# Patient Record
Sex: Female | Born: 1950
Health system: Southern US, Community
[De-identification: ages and names within clinical notes are randomized; demographics above are authoritative.]

## PROBLEM LIST (undated history)

## (undated) DIAGNOSIS — K219 Gastro-esophageal reflux disease without esophagitis: Secondary | ICD-10-CM

## (undated) DIAGNOSIS — Z9889 Other specified postprocedural states: Secondary | ICD-10-CM

## (undated) DIAGNOSIS — R351 Nocturia: Secondary | ICD-10-CM

## (undated) DIAGNOSIS — M1990 Unspecified osteoarthritis, unspecified site: Secondary | ICD-10-CM

## (undated) DIAGNOSIS — Z8719 Personal history of other diseases of the digestive system: Secondary | ICD-10-CM

## (undated) DIAGNOSIS — R3915 Urgency of urination: Secondary | ICD-10-CM

## (undated) DIAGNOSIS — R519 Headache, unspecified: Secondary | ICD-10-CM

## (undated) DIAGNOSIS — G8929 Other chronic pain: Secondary | ICD-10-CM

## (undated) DIAGNOSIS — Z87442 Personal history of urinary calculi: Secondary | ICD-10-CM

## (undated) DIAGNOSIS — R51 Headache: Secondary | ICD-10-CM

## (undated) DIAGNOSIS — F419 Anxiety disorder, unspecified: Secondary | ICD-10-CM

## (undated) DIAGNOSIS — G510 Bell's palsy: Secondary | ICD-10-CM

## (undated) DIAGNOSIS — R350 Frequency of micturition: Secondary | ICD-10-CM

## (undated) DIAGNOSIS — R112 Nausea with vomiting, unspecified: Secondary | ICD-10-CM

## (undated) DIAGNOSIS — N3010 Interstitial cystitis (chronic) without hematuria: Secondary | ICD-10-CM

## (undated) HISTORY — DX: Unspecified osteoarthritis, unspecified site: M19.90

## (undated) HISTORY — DX: Gastro-esophageal reflux disease without esophagitis: K21.9

---

## 1978-04-30 HISTORY — PX: TUBAL LIGATION: SHX77

## 1985-04-30 HISTORY — PX: ABDOMINAL HYSTERECTOMY: SHX81

## 2000-04-30 HISTORY — PX: EXTRACORPOREAL SHOCK WAVE LITHOTRIPSY: SHX1557

## 2001-04-30 HISTORY — PX: OTHER SURGICAL HISTORY: SHX169

## 2001-10-13 ENCOUNTER — Other Ambulatory Visit: Admission: RE | Admit: 2001-10-13 | Discharge: 2001-10-13 | Payer: Self-pay | Admitting: Unknown Physician Specialty

## 2003-05-01 HISTORY — PX: OTHER SURGICAL HISTORY: SHX169

## 2003-12-30 HISTORY — PX: BUNIONECTOMY: SHX129

## 2004-03-30 HISTORY — PX: OTHER SURGICAL HISTORY: SHX169

## 2005-01-24 ENCOUNTER — Ambulatory Visit: Payer: Self-pay | Admitting: Internal Medicine

## 2005-01-30 ENCOUNTER — Ambulatory Visit (HOSPITAL_COMMUNITY): Admission: RE | Admit: 2005-01-30 | Discharge: 2005-01-30 | Payer: Self-pay | Admitting: Internal Medicine

## 2005-01-30 ENCOUNTER — Ambulatory Visit: Payer: Self-pay | Admitting: Internal Medicine

## 2005-04-18 ENCOUNTER — Ambulatory Visit: Payer: Self-pay | Admitting: Internal Medicine

## 2006-04-03 ENCOUNTER — Ambulatory Visit: Payer: Self-pay | Admitting: Internal Medicine

## 2007-05-01 HISTORY — PX: BREAST REDUCTION SURGERY: SHX8

## 2008-03-30 HISTORY — PX: NASAL SINUS SURGERY: SHX719

## 2010-12-06 ENCOUNTER — Encounter: Payer: Self-pay | Admitting: Gastroenterology

## 2011-01-08 ENCOUNTER — Encounter: Payer: Self-pay | Admitting: Gastroenterology

## 2011-01-08 ENCOUNTER — Ambulatory Visit (INDEPENDENT_AMBULATORY_CARE_PROVIDER_SITE_OTHER): Payer: BC Managed Care – PPO | Admitting: Gastroenterology

## 2011-01-08 DIAGNOSIS — K5900 Constipation, unspecified: Secondary | ICD-10-CM | POA: Insufficient documentation

## 2011-01-08 DIAGNOSIS — K219 Gastro-esophageal reflux disease without esophagitis: Secondary | ICD-10-CM

## 2011-01-08 NOTE — Patient Instructions (Addendum)
You should change the way you are taking your antiacid medicine (nexium) so that you are taking it 20-30 minutes prior to a decent meal as that is the way the pill is designed to work most effectively. Add pepcid (OTC), take one pill at bedtime every night. Please start taking citrucel (orange flavored) powder fiber supplement.  This may cause some bloating at first but that usually goes away. Begin with a small spoonful and work your way up to a large, heaping spoonful daily over a week. Trial of gas-ex, 1 pill with every meal. A copy of this information will be made available to Dr. Sherril Croon. We will get your Dr. Karilyn Cota colonoscopy report from 2009. Return to see see Dr. Christella Hartigan in 5-6 week.

## 2011-01-08 NOTE — Progress Notes (Signed)
HPI: This is a   very pleasant 60 year old woman who is here with her daughter today.   She has problems with her stomach.  Swells after eating.  Bloating.  Full of air, Belching always helps.  Sometimes the bloating affects her breathing.  Very bloated. This has been a problem for several.  She saw Dr. Karilyn Cota in 2009.  Underwent colonoscopy/EGD.  She was put on nexium twice a day. She was told she has hemorrhoids.  Small sliding hernia was noted, antral gastritis.  She has had trouble getting in to see Dr. Karilyn Cota.  Daughter felt she was being "blown off."    Takes nexium at 5:30 eats BF 2-3 hours later.  Second pill is usually about 1-2 hours prior to BF.  No caffiene, no etoh, non smoker.    She tends to be constipated.  She eats high fiber oatmeal every AM.  Usually has BM every 2-3 days, has to push and strain.  Can see rectal bleeding with BMs.  Over the past year, stable weight.     Review of systems: Pertinent positive and negative review of systems were noted in the above HPI section.  All other review of systems was otherwise negative.   Past Medical History  Diagnosis Date  . Arthritis   . Fibromyalgia   . GERD (gastroesophageal reflux disease)   . Kidney stones     Past Surgical History  Procedure Date  . Shoulder surgery   . Abdominal hysterectomy   . Bladder suspension   . Kidney stone surgery   . Nasal sinus surgery   . Bunionectomy      reports that she has quit smoking. She has never used smokeless tobacco. She reports that she does not drink alcohol or use illicit drugs.  family history includes Heart disease in her father and Lymphoma in her mother.    Current Medications, Allergies were all reviewed with the patient via [facility] electronic medical record system.    Physical Exam: BP 120/68  Pulse 60  Ht 5\' 2"  (1.575 m)  Wt 142 lb 3.2 oz (64.501 kg)  BMI 26.01 kg/m2 Constitutional: generally well-appearing Psychiatric: alert and oriented  x3 Eyes: extraocular movements intact Mouth: oral pharynx moist, no lesions Neck: supple no lymphadenopathy Cardiovascular: heart regular rate and rhythm Lungs: clear to auscultation bilaterally Abdomen: soft, nontender, nondistended, no obvious ascites, no peritoneal signs, normal bowel sounds Extremities: no lower extremity edema bilaterally Skin: no lesions on visible extremities Rectal examination with female assistant in room: Small internal and external hemorrhoids, nonthrombosed, stool was brown and Hemoccult negative. No masses in the distal rectum.   Assessment and plan: 60 y.o. female with minor hemorrhoidal bleeding, constipation, chronic GERD  I recommend she adjust her proton pump inhibitor twice daily. She will also add H2 blocker at night, bedtime. She will begin Citrucel and Gas-X to help with the bloating, constipation and hopefully can improve her hemorrhoidal bleeding as well. She will return to see me in 4-5 and sooner if needed.

## 2011-02-12 ENCOUNTER — Encounter: Payer: Self-pay | Admitting: Gastroenterology

## 2011-02-12 ENCOUNTER — Ambulatory Visit (INDEPENDENT_AMBULATORY_CARE_PROVIDER_SITE_OTHER): Payer: BC Managed Care – PPO | Admitting: Gastroenterology

## 2011-02-12 VITALS — BP 108/76 | HR 72 | Ht 62.0 in | Wt 142.6 lb

## 2011-02-12 DIAGNOSIS — R141 Gas pain: Secondary | ICD-10-CM

## 2011-02-12 DIAGNOSIS — R143 Flatulence: Secondary | ICD-10-CM

## 2011-02-12 DIAGNOSIS — R140 Abdominal distension (gaseous): Secondary | ICD-10-CM

## 2011-02-12 NOTE — Patient Instructions (Addendum)
Gastric emptying scan to check stomach function.  [name] on 03/06/11 1 pm arrive 1245 pm Nothing to eat or drink after 7 am Hold your Nexium and Pepcid 48 hours prior to the test. Need Dr. Patty Sermons EGD/Colonoscopy from 2009 Start keeping a food diary. Continue on nexium twice daily, pepcid a night. Citrucel once daily.

## 2011-02-12 NOTE — Progress Notes (Signed)
HPI: This is a  pleasant 60 year old woman who is here with her daughter today.  Has been on nexium bid, pepcid qhs, citrucel and gas ex since her last visit 5-6 weeks ago, for the most part she is taking all her meds. Her bowels are moving much easier.  She is not bothered by consipation.   Cannot tell if any particular foods are causing swelling, bloating.    Swelling, bloating are her most concerning symptoms.  Belching can help for a while.  Drinks one carbonated bev a day.  Does not eat much dairy.  Overall stable weight.  No overt blood in stool except minor red blood on TP.    Has complained about "bloating for several years"    Past Medical History:   Arthritis                                                    Fibromyalgia                                                 GERD (gastroesophageal reflux disease)                       Kidney stones                                               Past Surgical History:   SHOULDER SURGERY                                             ABDOMINAL HYSTERECTOMY                                       BLADDER SUSPENSION                                           KIDNEY STONE SURGERY                                         NASAL SINUS SURGERY                                          BUNIONECTOMY                                                 reports that she quit smoking about 35 years ago. She has never used smokeless tobacco. She reports that she does not  drink alcohol or use illicit drugs.  family history includes Heart disease in her father and Lymphoma in her mother.    Current medicines and allergies were reviewed in [facility]    Physical Exam: BP 108/76  Pulse 72  Ht 5\' 2"  (1.575 m)  Wt 142 lb 9.6 oz (64.683 kg)  BMI 26.08 kg/m2 Constitutional: generally well-appearing Psychiatric: alert and oriented x3 Abdomen: soft, nontender, nondistended, no obvious ascites, no peritoneal signs, normal bowel  sounds     Assessment and plan: 60 y.o. female with chronic bloating  This has been a problem of hers for many years. I suspect is functional. We have not yet gotten results from her previous gastroenterologist and we'll try again to track those down. She will begin keeping a food diary. She will also have a gastric emptying scan. In the meantime she'll stand proton pump inhibitor twice daily, H2 blocker once nightly, Citrucel daily.

## 2011-02-27 ENCOUNTER — Telehealth: Payer: Self-pay | Admitting: Gastroenterology

## 2011-02-27 NOTE — Telephone Encounter (Signed)
Colonsocopy 03/2008 Dr. Karilyn Cota (for lower abd pain, IDA): normal colonoscopy EGD 03/2008 Dr. Karilyn Cota (for abd pain, IDA): 2cm HH, otherwise normal; duodenum biopsies normal

## 2011-03-06 ENCOUNTER — Encounter (HOSPITAL_COMMUNITY)
Admission: RE | Admit: 2011-03-06 | Discharge: 2011-03-06 | Disposition: A | Discharge status: Home / Self Care | Payer: BC Managed Care – PPO | Source: Ambulatory Visit | Attending: Gastroenterology | Admitting: Gastroenterology

## 2011-03-06 DIAGNOSIS — R140 Abdominal distension (gaseous): Secondary | ICD-10-CM

## 2011-03-06 DIAGNOSIS — R6881 Early satiety: Secondary | ICD-10-CM | POA: Insufficient documentation

## 2011-03-06 DIAGNOSIS — K3189 Other diseases of stomach and duodenum: Secondary | ICD-10-CM | POA: Insufficient documentation

## 2011-03-06 DIAGNOSIS — R110 Nausea: Secondary | ICD-10-CM | POA: Insufficient documentation

## 2011-03-06 DIAGNOSIS — R141 Gas pain: Secondary | ICD-10-CM | POA: Insufficient documentation

## 2011-03-06 DIAGNOSIS — R142 Eructation: Secondary | ICD-10-CM | POA: Insufficient documentation

## 2011-03-06 DIAGNOSIS — K449 Diaphragmatic hernia without obstruction or gangrene: Secondary | ICD-10-CM | POA: Insufficient documentation

## 2011-03-06 MED ORDER — TECHNETIUM TC 99M SULFUR COLLOID
2.0000 | Freq: Once | INTRAVENOUS | Status: AC | PRN
  Administered 2011-03-06: 2 via INTRAVENOUS

## 2011-04-02 ENCOUNTER — Encounter (HOSPITAL_BASED_OUTPATIENT_CLINIC_OR_DEPARTMENT_OTHER): Payer: Self-pay | Admitting: *Deleted

## 2011-04-02 ENCOUNTER — Telehealth: Payer: Self-pay | Admitting: Gastroenterology

## 2011-04-02 NOTE — Telephone Encounter (Signed)
Pt is having surgery on Thursday and wants to cx ROV.  She will call back to reschedule

## 2011-04-02 NOTE — Patient Instructions (Signed)
Pt instructed NPO p MN 12/5 except nexium and neurontin w sip of h20. To [facility] 12/6 at 0700.  Needs hgb, ekg on arrival. [facility] guidelines reviewed .  Pt verbalized her understanding.  

## 2011-04-02 NOTE — Telephone Encounter (Signed)
Left message on machine to call back  

## 2011-04-04 NOTE — Progress Notes (Signed)
Pt instructed NPO p MN 12/5 except nexium and neurontin w sip of h20. To [facility] 12/6 at 0700.  Needs hgb, ekg on arrival. [facility] guidelines reviewed .  Pt verbalized her understanding.

## 2011-04-05 ENCOUNTER — Encounter (HOSPITAL_BASED_OUTPATIENT_CLINIC_OR_DEPARTMENT_OTHER)
Admission: RE | Disposition: A | Discharge status: Home / Self Care | Payer: Self-pay | Source: Ambulatory Visit | Attending: Urology

## 2011-04-05 ENCOUNTER — Encounter (HOSPITAL_BASED_OUTPATIENT_CLINIC_OR_DEPARTMENT_OTHER): Payer: Self-pay | Admitting: *Deleted

## 2011-04-05 ENCOUNTER — Other Ambulatory Visit: Payer: Self-pay

## 2011-04-05 ENCOUNTER — Ambulatory Visit (HOSPITAL_BASED_OUTPATIENT_CLINIC_OR_DEPARTMENT_OTHER): Payer: BC Managed Care – PPO | Admitting: Anesthesiology

## 2011-04-05 ENCOUNTER — Ambulatory Visit (HOSPITAL_BASED_OUTPATIENT_CLINIC_OR_DEPARTMENT_OTHER)
Admission: RE | Admit: 2011-04-05 | Discharge: 2011-04-06 | Disposition: A | Discharge status: Home / Self Care | Payer: BC Managed Care – PPO | Source: Ambulatory Visit | Attending: Urology | Admitting: Urology

## 2011-04-05 ENCOUNTER — Encounter (HOSPITAL_BASED_OUTPATIENT_CLINIC_OR_DEPARTMENT_OTHER): Payer: Self-pay | Admitting: Anesthesiology

## 2011-04-05 DIAGNOSIS — N816 Rectocele: Secondary | ICD-10-CM | POA: Insufficient documentation

## 2011-04-05 DIAGNOSIS — K219 Gastro-esophageal reflux disease without esophagitis: Secondary | ICD-10-CM | POA: Insufficient documentation

## 2011-04-05 DIAGNOSIS — IMO0001 Reserved for inherently not codable concepts without codable children: Secondary | ICD-10-CM | POA: Insufficient documentation

## 2011-04-05 DIAGNOSIS — Z90710 Acquired absence of both cervix and uterus: Secondary | ICD-10-CM | POA: Insufficient documentation

## 2011-04-05 DIAGNOSIS — N3946 Mixed incontinence: Secondary | ICD-10-CM | POA: Insufficient documentation

## 2011-04-05 DIAGNOSIS — N8110 Cystocele, unspecified: Secondary | ICD-10-CM

## 2011-04-05 DIAGNOSIS — N8111 Cystocele, midline: Secondary | ICD-10-CM | POA: Insufficient documentation

## 2011-04-05 DIAGNOSIS — Z79899 Other long term (current) drug therapy: Secondary | ICD-10-CM | POA: Insufficient documentation

## 2011-04-05 HISTORY — PX: PUBOVAGINAL SLING: SHX1035

## 2011-04-05 HISTORY — DX: Anxiety disorder, unspecified: F41.9

## 2011-04-05 HISTORY — DX: Other specified postprocedural states: Z98.890

## 2011-04-05 HISTORY — DX: Other specified postprocedural states: R11.2

## 2011-04-05 LAB — POCT HEMOGLOBIN-HEMACUE: Hemoglobin: 13.5 g/dL (ref 12.0–15.0)

## 2011-04-05 SURGERY — CREATION, PUBOVAGINAL SLING
Anesthesia: General | Site: Vagina | Wound class: Clean Contaminated

## 2011-04-05 MED ORDER — CIPROFLOXACIN HCL 500 MG PO TABS
500.0000 mg | ORAL_TABLET | Freq: Two times a day (BID) | ORAL | Status: AC
  Administered 2011-04-05: 500 mg via ORAL

## 2011-04-05 MED ORDER — STERILE WATER FOR IRRIGATION IR SOLN
Status: DC | PRN
  Administered 2011-04-05: 1000 mL

## 2011-04-05 MED ORDER — POTASSIUM CHLORIDE IN NACL 20-0.45 MEQ/L-% IV SOLN: INTRAVENOUS | Status: DC

## 2011-04-05 MED ORDER — SCOPOLAMINE 1 MG/3DAYS TD PT72: 1.0000 | MEDICATED_PATCH | TRANSDERMAL | Status: DC

## 2011-04-05 MED ORDER — SODIUM CHLORIDE 0.9 % IV SOLN
INTRAVENOUS | Status: DC | PRN
  Administered 2011-04-05 (×2): via VAGINAL

## 2011-04-05 MED ORDER — ONDANSETRON HCL 4 MG/2ML IJ SOLN
INTRAMUSCULAR | Status: DC | PRN
  Administered 2011-04-05: 4 mg via INTRAVENOUS

## 2011-04-05 MED ORDER — KETOROLAC TROMETHAMINE 30 MG/ML IJ SOLN
INTRAMUSCULAR | Status: DC | PRN
  Administered 2011-04-05: 30 mg via INTRAVENOUS

## 2011-04-05 MED ORDER — ACETAMINOPHEN 10 MG/ML IV SOLN
1000.0000 mg | Freq: Once | INTRAVENOUS | Status: AC
  Administered 2011-04-05: 1000 mg via INTRAVENOUS

## 2011-04-05 MED ORDER — LACTATED RINGERS IV SOLN
INTRAVENOUS | Status: DC
  Administered 2011-04-05: 100 mL/h via INTRAVENOUS
  Administered 2011-04-05 (×2): via INTRAVENOUS
  Administered 2011-04-05: 100 mL/h via INTRAVENOUS

## 2011-04-05 MED ORDER — KETOROLAC TROMETHAMINE 30 MG/ML IJ SOLN: 15.0000 mg | Freq: Once | INTRAMUSCULAR | Status: AC | PRN

## 2011-04-05 MED ORDER — OXYBUTYNIN CHLORIDE 5 MG PO TABS: 5.0000 mg | ORAL_TABLET | Freq: Three times a day (TID) | ORAL | Status: DC | PRN

## 2011-04-05 MED ORDER — DIPHENHYDRAMINE HCL 12.5 MG/5ML PO ELIX: 12.5000 mg | ORAL_SOLUTION | Freq: Four times a day (QID) | ORAL | Status: DC | PRN

## 2011-04-05 MED ORDER — OXYCODONE-ACETAMINOPHEN 5-325 MG PO TABS: 1.0000 | ORAL_TABLET | ORAL | Status: DC | PRN

## 2011-04-05 MED ORDER — KETOROLAC TROMETHAMINE 30 MG/ML IJ SOLN
30.0000 mg | Freq: Four times a day (QID) | INTRAMUSCULAR | Status: DC
  Administered 2011-04-05 – 2011-04-06 (×3): 30 mg via INTRAVENOUS

## 2011-04-05 MED ORDER — HYDROMORPHONE HCL PF 1 MG/ML IJ SOLN: 0.5000 mg | INTRAMUSCULAR | Status: DC | PRN

## 2011-04-05 MED ORDER — SODIUM CHLORIDE 0.9 % IR SOLN
Status: DC | PRN
  Administered 2011-04-05: 08:00:00

## 2011-04-05 MED ORDER — STERILE WATER FOR IRRIGATION IR SOLN
Status: DC | PRN
  Administered 2011-04-05: 3000 mL

## 2011-04-05 MED ORDER — HYDROMORPHONE HCL PF 1 MG/ML IJ SOLN: 0.2500 mg | INTRAMUSCULAR | Status: DC | PRN

## 2011-04-05 MED ORDER — SCOPOLAMINE 1 MG/3DAYS TD PT72
1.0000 | MEDICATED_PATCH | TRANSDERMAL | Status: DC
  Administered 2011-04-05: 1.5 mg via TRANSDERMAL

## 2011-04-05 MED ORDER — ACETAMINOPHEN 10 MG/ML IV SOLN
1000.0000 mg | Freq: Four times a day (QID) | INTRAVENOUS | Status: DC
  Administered 2011-04-05 – 2011-04-06 (×3): 1000 mg via INTRAVENOUS

## 2011-04-05 MED ORDER — SENNOSIDES-DOCUSATE SODIUM 8.6-50 MG PO TABS: 2.0000 | ORAL_TABLET | Freq: Every day | ORAL | Status: DC

## 2011-04-05 MED ORDER — DROPERIDOL 2.5 MG/ML IJ SOLN
INTRAMUSCULAR | Status: DC | PRN
  Administered 2011-04-05: 0.625 mg via INTRAVENOUS

## 2011-04-05 MED ORDER — BELLADONNA ALKALOIDS-OPIUM 16.2-60 MG RE SUPP
RECTAL | Status: DC | PRN
  Administered 2011-04-05: 1 via RECTAL

## 2011-04-05 MED ORDER — CEFAZOLIN SODIUM 1-5 GM-% IV SOLN
1.0000 g | Freq: Once | INTRAVENOUS | Status: AC
  Administered 2011-04-05: 1 g via INTRAVENOUS

## 2011-04-05 MED ORDER — LIDOCAINE HCL (CARDIAC) 20 MG/ML IV SOLN
INTRAVENOUS | Status: DC | PRN
  Administered 2011-04-05: 60 mg via INTRAVENOUS

## 2011-04-05 MED ORDER — DEXAMETHASONE SODIUM PHOSPHATE 4 MG/ML IJ SOLN
INTRAMUSCULAR | Status: DC | PRN
  Administered 2011-04-05: 10 mg via INTRAVENOUS

## 2011-04-05 MED ORDER — DIPHENHYDRAMINE HCL 50 MG/ML IJ SOLN: 12.5000 mg | Freq: Four times a day (QID) | INTRAMUSCULAR | Status: DC | PRN

## 2011-04-05 MED ORDER — ESTRADIOL 0.1 MG/GM VA CREA
TOPICAL_CREAM | VAGINAL | Status: DC | PRN
  Administered 2011-04-05: 1 via VAGINAL

## 2011-04-05 MED ORDER — MIDAZOLAM HCL 5 MG/5ML IJ SOLN
INTRAMUSCULAR | Status: DC | PRN
  Administered 2011-04-05: 1 mg via INTRAVENOUS

## 2011-04-05 MED ORDER — FENTANYL CITRATE 0.05 MG/ML IJ SOLN
INTRAMUSCULAR | Status: DC | PRN
  Administered 2011-04-05: 25 ug via INTRAVENOUS
  Administered 2011-04-05: 50 ug via INTRAVENOUS
  Administered 2011-04-05: 25 ug via INTRAVENOUS
  Administered 2011-04-05: 50 ug via INTRAVENOUS
  Administered 2011-04-05: 25 ug via INTRAVENOUS

## 2011-04-05 MED ORDER — ONDANSETRON HCL 4 MG/2ML IJ SOLN: 4.0000 mg | INTRAMUSCULAR | Status: DC | PRN

## 2011-04-05 MED ORDER — PROMETHAZINE HCL 25 MG/ML IJ SOLN: 6.2500 mg | INTRAMUSCULAR | Status: DC | PRN

## 2011-04-05 MED ORDER — ZOLPIDEM TARTRATE 5 MG PO TABS: 5.0000 mg | ORAL_TABLET | Freq: Every evening | ORAL | Status: DC | PRN

## 2011-04-05 MED ORDER — INDIGOTINDISULFONATE SODIUM 8 MG/ML IJ SOLN
INTRAMUSCULAR | Status: DC | PRN
  Administered 2011-04-05: 5 mL via INTRAVENOUS

## 2011-04-05 SURGICAL SUPPLY — 56 items
BAG DRAIN URO-CYSTO SKYTR STRL (DRAIN) IMPLANT
BAG URINE DRAINAGE (UROLOGICAL SUPPLIES) ×3 IMPLANT
BLADE SURG 10 STRL SS (BLADE) ×3 IMPLANT
BLADE SURG 15 STRL LF DISP TIS (BLADE) ×2 IMPLANT
BLADE SURG 15 STRL SS (BLADE) ×1
BLADE SURG ROTATE 9660 (MISCELLANEOUS) ×3 IMPLANT
BOOTIES KNEE HIGH SLOAN (MISCELLANEOUS) ×3 IMPLANT
CANISTER SUCTION 1200CC (MISCELLANEOUS) IMPLANT
CANISTER SUCTION 2500CC (MISCELLANEOUS) ×6 IMPLANT
CATH FOLEY 2WAY SLVR  5CC 16FR (CATHETERS) ×1
CATH FOLEY 2WAY SLVR 5CC 16FR (CATHETERS) ×2 IMPLANT
CLOTH BEACON ORANGE TIMEOUT ST (SAFETY) ×3 IMPLANT
COVER MAYO STAND STRL (DRAPES) IMPLANT
DERMABOND ADVANCED (GAUZE/BANDAGES/DRESSINGS)
DERMABOND ADVANCED .7 DNX12 (GAUZE/BANDAGES/DRESSINGS) IMPLANT
DISSECTOR BLUNT TIP ENDO 5MM (MISCELLANEOUS) IMPLANT
DISSECTOR ROUND CHERRY 3/8 STR (MISCELLANEOUS) IMPLANT
DRAIN PENROSE 18X1/2 LTX STRL (DRAIN) IMPLANT
DRAPE CAMERA CLOSED 9X96 (DRAPES) ×3 IMPLANT
DRAPE LG THREE QUARTER DISP (DRAPES) IMPLANT
DRAPE UNDERBUTTOCKS STRL (DRAPE) ×3 IMPLANT
FLOSEAL 10ML (HEMOSTASIS) IMPLANT
GAUZE SPONGE 4X4 16PLY XRAY LF (GAUZE/BANDAGES/DRESSINGS) ×3 IMPLANT
GLOVE BIO SURGEON STRL SZ7.5 (GLOVE) ×3 IMPLANT
GLOVE INDICATOR 6.5 STRL GRN (GLOVE) ×9 IMPLANT
GOWN PREVENTION PLUS LG XLONG (DISPOSABLE) ×3 IMPLANT
GOWN STRL REIN XL XLG (GOWN DISPOSABLE) ×3 IMPLANT
NEEDLE HYPO 22GX1.5 SAFETY (NEEDLE) ×6 IMPLANT
PACK CYSTOSCOPY (CUSTOM PROCEDURE TRAY) ×3 IMPLANT
PACKING VAGINAL (PACKING) ×3 IMPLANT
PAD PREP 24X48 CUFFED NSTRL (MISCELLANEOUS) ×3 IMPLANT
PENCIL BUTTON HOLSTER BLD 10FT (ELECTRODE) ×3 IMPLANT
PLUG CATH AND CAP STER (CATHETERS) ×3 IMPLANT
RETRACTOR LONRSTAR 16.6X16.6CM (MISCELLANEOUS) ×2 IMPLANT
RETRACTOR STAY HOOK 5MM (MISCELLANEOUS) ×3 IMPLANT
RETRACTOR STER APS 16.6X16.6CM (MISCELLANEOUS) ×3
SHEET LAVH (DRAPES) ×3 IMPLANT
SLING SOLYX SYSTEM SIS BX (SLING) IMPLANT
SPONGE LAP 18X18 X RAY DECT (DISPOSABLE) IMPLANT
SPONGE LAP 4X18 X RAY DECT (DISPOSABLE) ×6 IMPLANT
SUCTION FRAZIER TIP 10 FR DISP (SUCTIONS) ×3 IMPLANT
SUT ETHILON 2 0 PS N (SUTURE) IMPLANT
SUT MON AB 2-0 SH 27 (SUTURE)
SUT MON AB 2-0 SH27 (SUTURE) IMPLANT
SUT SILK 3 0 PS 1 (SUTURE) ×3 IMPLANT
SUT VIC AB 2-0 UR6 27 (SUTURE) ×24 IMPLANT
SYR BULB IRRIGATION 50ML (SYRINGE) ×3 IMPLANT
SYR CONTROL 10ML LL (SYRINGE) ×6 IMPLANT
SYRINGE 10CC LL (SYRINGE) ×3 IMPLANT
SYS PRF KIT VAG SUP UPHOLD (Sling) ×3 IMPLANT
TOWEL OR 17X24 6PK STRL BLUE (TOWEL DISPOSABLE) ×6 IMPLANT
TOWEL OR 17X26 4PK STRL BLUE (TOWEL DISPOSABLE) ×3 IMPLANT
TUBE CONNECTING 12X1/4 (SUCTIONS) ×6 IMPLANT
WATER STERILE IRR 3000ML UROMA (IV SOLUTION) ×3 IMPLANT
WATER STERILE IRR 500ML POUR (IV SOLUTION) ×6 IMPLANT
YANKAUER SUCT BULB TIP NO VENT (SUCTIONS) IMPLANT

## 2011-04-05 NOTE — Anesthesia Preprocedure Evaluation (Addendum)
Anesthesia Evaluation  Patient identified by MRN, date of birth, ID band Patient awake    Reviewed: Allergy & Precautions, H&P , NPO status , Patient's Chart, lab work & pertinent test results  History of Anesthesia Complications (+) PONV  Airway Mallampati: II TM Distance: >3 FB Neck ROM: Full    Dental No notable dental hx.    Pulmonary neg pulmonary ROS,  clear to auscultation  Pulmonary exam normal       Cardiovascular neg cardio ROS Regular Normal    Neuro/Psych PSYCHIATRIC DISORDERS Anxiety  Neuromuscular disease    GI/Hepatic negative GI ROS, Neg liver ROS, GERD-  Medicated,  Endo/Other  Negative Endocrine ROS  Renal/GU negative Renal ROS  Genitourinary negative   Musculoskeletal negative musculoskeletal ROS (+)   Abdominal   Peds negative pediatric ROS (+)  Hematology negative hematology ROS (+)   Anesthesia Other Findings   Reproductive/Obstetrics negative OB ROS                          Anesthesia Physical Anesthesia Plan  ASA: II  Anesthesia Plan: General   Post-op Pain Management:    Induction: Intravenous  Airway Management Planned: LMA  Additional Equipment:   Intra-op Plan:   Post-operative Plan:   Informed Consent: I have reviewed the patients History and Physical, chart, labs and discussed the procedure including the risks, benefits and alternatives for the proposed anesthesia with the patient or authorized representative who has indicated his/her understanding and acceptance.   Dental advisory given  Plan Discussed with: CRNA  Anesthesia Plan Comments:         Anesthesia Quick Evaluation

## 2011-04-05 NOTE — H&P (Signed)
Urology Admission H&P  Chief Complaint: urge and stress incontinence  History of Present Illness: The patient is a 60 year old female, para 3-3-0, who was evaluated in October of 2012, for a six-month history of mixed stress and urge incontinence. She has frequency Q. one half hour. She complains of pubic pain, and post void spasm. She is status post TAH in 1987, and anterior vault repair, enterocele repair, and rectocele repair, with associated TVT sling in 2005. Currently, the patient is found to have a bladder capacity of 368 cc, with early sensation. On physical exam she has a grade 3 cystocele, and grade 3 rectocele, with no enterocele. The area of the TVT appears normal, with good mucosal coverage. Cystoscopy reveals no evidence of urethral erosion.  Urodynamics shows a first sensation at 167 cc. There is no leak for Valsalva leak point pressure determination. Her flow rate is 13 cc per second, with bladder pressure of 9 cementer water. PVR is 39 cc.  The patient was felt to have surgery urge incontinence, with pelvic floor prolapse. She is concerned with the vaginal vault size, and she continues to be 6 reactive. We have discussed mesh surgical repair, and the fact that mesh sacrospinous fixation will contract approximately 25%, but should not make her vagina 2 small 4 sexual activity. The patient did have vaginal foreshortening in her previous surgery.  Past Medical History  Diagnosis Date  . Arthritis   . GERD (gastroesophageal reflux disease)   . Kidney stones   . PONV (postoperative nausea and vomiting)   . Fibromyalgia     rt facial nerve pain  . Hiatal hernia   . Anxiety     hx panic attacks;  no meds   Past Surgical History  Procedure Date  . Shoulder surgery   . Abdominal hysterectomy   . Bladder suspension   . Kidney stone surgery   . Nasal sinus surgery   . Bunionectomy   . Abdominal hysterectomy   . Breast surgery '09    bil reduction  . Tubal ligation '80     Home Medications:  Prescriptions prior to admission  Medication Sig Dispense Refill  . calcitonin, salmon, (MIACALCIN/FORTICAL) 200 UNIT/ACT nasal spray Place 1 spray into the nose daily.        . cholecalciferol (VITAMIN D) 1000 UNITS tablet Take 3,000 Units by mouth daily.        Marland Kitchen conjugated estrogens (PREMARIN) vaginal cream Place vaginally daily.        Marland Kitchen esomeprazole (NEXIUM) 40 MG capsule Take 40 mg by mouth 2 (two) times daily.        . fish oil-omega-3 fatty acids 1000 MG capsule Take 3,600 mg by mouth daily.        . fluticasone (FLONASE) 50 MCG/ACT nasal spray Place 2 sprays into the nose daily.        Marland Kitchen gabapentin (NEURONTIN) 300 MG capsule Take 300 mg by mouth 3 (three) times daily.        . Multiple Vitamins-Minerals (MULTIVITAMIN WITH MINERALS) tablet Take 1 tablet by mouth daily.        . vitamin C (ASCORBIC ACID) 500 MG tablet Take 500 mg by mouth daily.         Allergies:  Allergies  Allergen Reactions  . Milk-Related Compounds Other (See Comments)    DAIRY PRODUCTS--- SCRATCHY THROAT/ HX SINUS INFECTION  . Bactrim Hives    Family History  Problem Relation Age of Onset  . Lymphoma Mother   .  Heart disease Father    Social History:  reports that she quit smoking about 35 years ago. She has never used smokeless tobacco. She reports that she does not drink alcohol or use illicit drugs.  ROS  Physical Exam:  Vital signs in last 24 hours: Temp:  [97.5 F (36.4 C)] 97.5 F (36.4 C) (12/06 0740) Pulse Rate:  [55] 55  (12/06 0740) Resp:  [16] 16  (12/06 0740) BP: (119)/(74) 119/74 mmHg (12/06 0740) SpO2:  [97 %] 97 % (12/06 0740) Physical ExamPelvec: Grade 3 cystocele, Grade 3 Rectocele. No urethral hypermobility. + vaginal atrophy.   Laboratory Data:  Results for orders placed during the hospital encounter of 04/05/11 (from the past 24 hour(s))  POCT HEMOGLOBIN-HEMACUE     Status: Normal   Collection Time   04/05/11  7:37 AM      Component Value Range    Hemoglobin 13.5  12.0 - 15.0 (g/dL)   No results found for this or any previous visit (from the past 240 hour(s)). Creatinine:   Impression/Assessment:  Recurrent pelvic floor prolapse. The patient has large cystocele and rectocele. She will have [employer] uphold light mesh sacrospinous repair, and posterior pedicle repair. Procedure and complications have previously been discussed with the patient. Expectation for the result is excellent.  Plan:  Surgical repair of anterior vault prolapse, recurrent, and posterior vault prolapse, recurrent.  ,  I 04/05/2011, 8:13 AM

## 2011-04-05 NOTE — Op Note (Signed)
Pre-operative diagnosis: Anterior vaginal wall prolapse, recurrent  Postoperative diagnosis: Same  Operation: Anterior vaginal vault Culposacro-pexy with [employer] Uphold-Light mesh repair  Surgeon   Anesthesia general LMA  Preparation: After appropriate preanesthesia, the patient was brought to the operating room, and placed on the operating table in the dorsal supine position, where general LMA anesthesia was introduced.  Gen. "timeout" was accomplished. Expectation of success is excellent.  Review history: This 60 year old female, para 3-3-0, he is 6 years status post anterior vault repair, posterior vault repair, enterocele repair, and TVT transvaginal tape for stress incontinence and pelvic floor prolapse. Over the last 6 months, the patient has developed loss of level I support, and currently has grade 3 cystocele and rectocele, but no stress incontinence. Urodynamics was accomplished, and showed no leakage with or without reduction of the patient's cystocele. After extensive counseling, the patient has selected transvaginal mesh sacral repair.  Procedure: The half portion to long 1 retractors placed, and Foley is placed in the bladder, and the bladder drained of fluid. Vaginal inspection reveals grade 3 cystocele, bordering on grade 4, at the vaginal introitus. There is a large amount of scar in the anterior vault, as well as at the apex. The rectocele felt to be a grade 2 on examination, and scars noted at the distal portion of the posterior vaginal mucosa.  Using 50 cc of Marcaine 0.25% with epinephrine 1-200,000, the area of incision is injected, after being marked with a blue marking pen. This was marked 1 cm beneath the urethrovaginal fold. Using a 15 blade, horizontal incision is made across the anterior vaginal wall, and subcutaneous tissue dissected. The cystocele is dissected, and a Kelly plication was accomplished using 2-0 Vicryl horizontal mattress sutures. The  telemetry floor is then dissected, with finger palpation and dissection of the ischial spines bilaterally. The sacrospinous ligament is identified bilaterally.  Using the U. device, the suture limbs of the uphold light mesh are placed bilaterally through the sacrospinous ligaments. They are examined, and found to be at least one finger breath medial to the ischial spines. The mesh is then secured with 3 2-0 Vicryl sutures in the distal and proximal portions of the mesh, to tack it to the bladder wall, and the limbs are secured bilaterally. With the mesh in excellent position, with no folding, and loose against the bladder, the limbs are cut, and removed. Irrigation was accomplished, and there is no significant bleeding. Because the patient continues to be 6 reactive, I elected to leave all of the vaginal mucosa available, and therefore, closed the wound with running 2-0 Vicryl suture, as well as interrupted sutures.  Vaginal inspection now reveals that the patient's rectocele is minimal, and there was no recurrence of her enterocele. She has excellent level I support, and I elected to forego posterior dissection at this time, understanding that the patient may need to come back in the future for posterior dissection.  Indigocarmine was given, and cystoscopy was accomplished, shows normal urethra, normal bladder base, with clear blue reflux from both cortices. There is no bladder stone, tumor, or bladder diverticular formation.  The patient tolerated procedure well. The bladder was drained the Foley catheter, and vaginal packing with Estrace was placed. She was awakened taken recovery room in good condition.

## 2011-04-05 NOTE — Anesthesia Postprocedure Evaluation (Signed)
  Anesthesia Post-op Note  Patient: [name]  Procedure(s) Performed:  PUBO-VAGINAL SLING - ANTERIOR UPHOLD LITE  ; CYSTOSCOPY  Patient Location: PACU  Anesthesia Type: General  Level of Consciousness: awake and alert   Airway and Oxygen Therapy: Patient Spontanous Breathing  Post-op Pain: mild  Post-op Assessment: Post-op Vital signs reviewed, Patient's Cardiovascular Status Stable, Respiratory Function Stable, Patent Airway and No signs of Nausea or vomiting  Post-op Vital Signs: stable  Complications: No apparent anesthesia complications

## 2011-04-05 NOTE — Transfer of Care (Signed)
Immediate Anesthesia Transfer of Care Note  Patient: [name]  Procedure(s) Performed:  Leonides Grills - ANTERIOR UPHOLD LITE  ; CYSTOSCOPY  Patient Location: Patient transported to PACU with oxygen via face mask at 6 Liters / Min  Anesthesia Type: General  Level of Consciousness: awake and alert   Airway & Oxygen Therapy: Patient Spontanous Breathing and Patient connected to face mask oxygen  Post-op Assessment: Report given to PACU RN and Post -op Vital signs reviewed and stable  Post vital signs: Reviewed and stable  Complications: No apparent anesthesia complications   Pt comfortable

## 2011-04-05 NOTE — Anesthesia Procedure Notes (Signed)
Procedure Name: LMA Insertion Date/Time: 04/05/2011 8:33 AM Performed by: Lorrin Jackson Pre-anesthesia Checklist: Patient identified, Emergency Drugs available, Suction available and Patient being monitored Patient Re-evaluated:Patient Re-evaluated prior to inductionOxygen Delivery Method: Circle System Utilized Preoxygenation: Pre-oxygenation with 100% oxygen Intubation Type: IV induction Ventilation: Mask ventilation without difficulty LMA: LMA with gastric port inserted LMA Size: 4.0 Number of attempts: 1 Placement Confirmation: positive ETCO2 and breath sounds checked- equal and bilateral Tube secured with: Tape Dental Injury: Teeth and Oropharynx as per pre-operative assessment  Comments: NG tube via gastric port, Sx small amount of yellow tinged gastric content.

## 2011-04-06 ENCOUNTER — Encounter (HOSPITAL_BASED_OUTPATIENT_CLINIC_OR_DEPARTMENT_OTHER): Payer: Self-pay | Admitting: Urology

## 2011-04-06 MED ORDER — HYDROCODONE-ACETAMINOPHEN 10-325 MG PO TABS: 1.0000 | ORAL_TABLET | Freq: Four times a day (QID) | ORAL | Status: AC | PRN

## 2011-04-06 MED ORDER — HYDROCODONE-ACETAMINOPHEN 10-325 MG PO TABS: 1.0000 | ORAL_TABLET | Freq: Four times a day (QID) | ORAL | Status: DC | PRN

## 2011-04-06 MED ORDER — CIPROFLOXACIN HCL 500 MG PO TABS: 500.0000 mg | ORAL_TABLET | Freq: Two times a day (BID) | ORAL | Status: AC

## 2011-04-06 NOTE — Discharge Summary (Signed)
Physician Discharge Summary  Patient ID: [name] MRN: [id number] DOB/AGE: [birth year] 60 y.o.  Admit date: 04/05/2011 Discharge date: 04/06/2011  Admission Diagnoses:  Recurrent cystocele  Discharge Diagnoses: Same Active Problems:  * No active hospital problems. *    Discharged Condition: [facility] Course: Surgery, 04/05/11  Consults:none  Significant Diagnostic Studies: labs:   Treatments:Surgery 04/05/11  Discharge Exam: Blood pressure 107/62, pulse 52, temperature 97.4 F (36.3 C), temperature source Oral, resp. rate 18, height 5\' 2"  (1.575 m), weight 64.411 kg (142 lb), SpO2 96.00%. General appearance: alert and cooperative Pelvic: not indicated; status post hysterectomy, negative ROS  Disposition: Home or Self Care  Discharge Orders    Future Orders Please Complete By Expires   Diet - low sodium heart healthy      Increase activity slowly      Discharge instructions      Comments:   Ok to drive in 1 week. OK to shower OK to go shopping for 2 hrs.   Call MD for:  temperature >100.4      Call MD for:      Scheduling Instructions:   Severe vaginal bleedingHome Care Instructions: call if severe vaginal bleeding.  Activity Rest for the remainder of the day. Do not drive or operate equipment today. You may resume normal activities in one to two days unless specified otherwise by Dr. Daune Perch  Special Instructions Call [phone] if you have any of the following symptoms: Persistent or heavy bleeding Bleeding which continues after the first few urinations Large blood clots that are difficult to pass Urine stream diminishes or stops completely Fever equal to or higher than 101 degrees F Cloudy urine with a strong, foul odor Severe pain  Females should always wipe from front to back. You may feel some burning pain when you urinate. This should disappear with time. Applying moist heat to the lower abdomen or a hot tub bath may help relieve the  pain.  Follow-up In one week. Appointment has been made.        Discharge Medication List as of 04/06/2011  8:20 AM    CONTINUE these medications which have NOT CHANGED   Details  calcitonin, salmon, (MIACALCIN/FORTICAL) 200 UNIT/ACT nasal spray Place 1 spray into the nose daily.  , Until Discontinued, Historical Med    cholecalciferol (VITAMIN D) 1000 UNITS tablet Take 3,000 Units by mouth daily.  , Until Discontinued, Historical Med    conjugated estrogens (PREMARIN) vaginal cream Place vaginally daily.  , Until Discontinued, Historical Med    esomeprazole (NEXIUM) 40 MG capsule Take 40 mg by mouth 2 (two) times daily.  , Until Discontinued, Historical Med    fish oil-omega-3 fatty acids 1000 MG capsule Take 3,600 mg by mouth daily.  , Until Discontinued, Historical Med    fluticasone (FLONASE) 50 MCG/ACT nasal spray Place 2 sprays into the nose daily.  , Until Discontinued, Historical Med    gabapentin (NEURONTIN) 300 MG capsule Take 300 mg by mouth 3 (three) times daily.  , Until Discontinued, Historical Med    Multiple Vitamins-Minerals (MULTIVITAMIN WITH MINERALS) tablet Take 1 tablet by mouth daily.  , Until Discontinued, Historical Med    vitamin C (ASCORBIC ACID) 500 MG tablet Take 500 mg by mouth daily.  , Until Discontinued, Historical Med       Follow-up Information    Follow up with Jethro Bolus I, MD.   Contact information:   [address] [place], [address] [facility]  [place] [address] [phone]          Signed: Jethro Bolus I 04/06/2011, 8:22 AM

## 2011-04-11 ENCOUNTER — Ambulatory Visit: Payer: BC Managed Care – PPO | Admitting: Gastroenterology

## 2011-12-17 ENCOUNTER — Other Ambulatory Visit: Payer: Self-pay | Admitting: Urology

## 2012-02-27 ENCOUNTER — Encounter (HOSPITAL_BASED_OUTPATIENT_CLINIC_OR_DEPARTMENT_OTHER): Payer: Self-pay | Admitting: *Deleted

## 2012-02-27 NOTE — Progress Notes (Addendum)
NPO AFTER MN. ARRIVES AT 0600. NEEDS HG. PRE-OP PENDING FOR DR LOWE. WILL TAKE NEXIUM AND GABAPENTIN AM OF SURG W/ SIP OF WATER. REVIEWED [facility] GUIDELINES, WILL BRING MEDS.  PT STATES IS ON A GLUTEN-FREE DIET.

## 2012-02-29 NOTE — H&P (Addendum)
   [name] is a 61 year old, G4P3, status post tubal ligation and hysterectomy who presents for preop evaluation for removal of both tubes and ovaries.  She has a history of right lower quadrant pain.  She has an ovarian cyst which on ultrasound in May showed a 2.1 cm cyst, clear fluid consistent with a simple cyst.  She does have a CAT scan showing this.  She is postmenopausal.  She had a normal CA 125.  She does have intermittent right lower quadrant pain.  She is also having rectocele repair by Dr. Patsi Sears, and she would like to have both ovaries removed at this time in a combined procedure.    Past medical history is significant for hiatal hernia and also allergies, osteoporosis, arthritis, history of urinary tract infections.  She has had multiple surgeries including multiple bladder surgeries, breast reduction, shoulder surgery and hysterectomy.  She has multiple medicines including Premarin vaginal cream, Nexium, Neurontin, Flonase, calcitonin that she takes for osteoporosis, multivitamins, calcium and fish oil.  She has no known drug allergies. O: Blood pressure 130/80.  Heart regular rate and rhythm.  Lungs are clear to auscultation bilaterally.  Abdomen is nondistended and nontender.  Pelvic exam was deferred.  Recent ultrasound shows a 2 cm right ovarian cyst.  Left ovary appears to be normal.  This is also consistent with a recent CAT scan. A/P: Right lower quadrant pain.  Stable right ovarian cyst.  Patient desires laparoscopic removal of both tubes and ovaries seeing that she is postmenopausal.  Discussed the risks and benefits of the procedure at length which include but not limited to risk of infection, bleeding, damage to bowel, bladder, ureters.  If there is something precancerous or cancerous, it may require further surgeries.  If we are unable to complete the surgery laparoscopically, she would like to discontinue and discuss laparotomy options.  I discussed the procedure, its recovery  at length.  She gives her informed consent. Dineen Kid Rana Snare, MD/tch   patiennt has been seen and examined.   All of her questions were answered.  Labs and vital signs reviewed.  Informed consent has been obtained.                03/03/12 0715 DL

## 2012-03-02 NOTE — Anesthesia Preprocedure Evaluation (Addendum)
Anesthesia Evaluation  Patient identified by MRN, date of birth, ID band Patient awake    Reviewed: Allergy & Precautions, H&P , NPO status , Patient's Chart, lab work & pertinent test results  History of Anesthesia Complications (+) PONV  Airway Mallampati: II TM Distance: >3 FB Neck ROM: Full    Dental No notable dental hx.    Pulmonary neg pulmonary ROS,  breath sounds clear to auscultation  Pulmonary exam normal       Cardiovascular Exercise Tolerance: Good negative cardio ROS  Rhythm:Regular Rate:Normal  ECG: SB 48   Neuro/Psych Anxiety Right trigeminal neuralgia, which often makes her neck spasm and hurt also.    GI/Hepatic Neg liver ROS, hiatal hernia, GERD-  Medicated,  Endo/Other  negative endocrine ROS  Renal/GU negative Renal ROS  negative genitourinary   Musculoskeletal negative musculoskeletal ROS (+)   Abdominal   Peds negative pediatric ROS (+)  Hematology negative hematology ROS (+)   Anesthesia Other Findings   Reproductive/Obstetrics negative OB ROS                         Anesthesia Physical Anesthesia Plan  ASA: II  Anesthesia Plan: General   Post-op Pain Management:    Induction: Intravenous  Airway Management Planned: Oral ETT  Additional Equipment:   Intra-op Plan:   Post-operative Plan: Extubation in OR  Informed Consent: I have reviewed the patients History and Physical, chart, labs and discussed the procedure including the risks, benefits and alternatives for the proposed anesthesia with the patient or authorized representative who has indicated his/her understanding and acceptance.   Dental advisory given  Plan Discussed with: CRNA  Anesthesia Plan Comments: (Scopolamine patch ordered. (Used last time with success).)       Anesthesia Quick Evaluation

## 2012-03-03 ENCOUNTER — Ambulatory Visit (HOSPITAL_BASED_OUTPATIENT_CLINIC_OR_DEPARTMENT_OTHER): Payer: Managed Care, Other (non HMO) | Admitting: Anesthesiology

## 2012-03-03 ENCOUNTER — Encounter (HOSPITAL_BASED_OUTPATIENT_CLINIC_OR_DEPARTMENT_OTHER): Payer: Self-pay | Admitting: Anesthesiology

## 2012-03-03 ENCOUNTER — Ambulatory Visit (HOSPITAL_BASED_OUTPATIENT_CLINIC_OR_DEPARTMENT_OTHER)
Admission: RE | Admit: 2012-03-03 | Discharge: 2012-03-04 | Disposition: A | Discharge status: Home / Self Care | Payer: Managed Care, Other (non HMO) | Source: Ambulatory Visit | Attending: Obstetrics and Gynecology | Admitting: Obstetrics and Gynecology

## 2012-03-03 ENCOUNTER — Encounter (HOSPITAL_BASED_OUTPATIENT_CLINIC_OR_DEPARTMENT_OTHER)
Admission: RE | Disposition: A | Discharge status: Home / Self Care | Payer: Self-pay | Source: Ambulatory Visit | Attending: Obstetrics and Gynecology

## 2012-03-03 ENCOUNTER — Encounter (HOSPITAL_BASED_OUTPATIENT_CLINIC_OR_DEPARTMENT_OTHER): Payer: Self-pay | Admitting: *Deleted

## 2012-03-03 DIAGNOSIS — N949 Unspecified condition associated with female genital organs and menstrual cycle: Secondary | ICD-10-CM | POA: Insufficient documentation

## 2012-03-03 DIAGNOSIS — K219 Gastro-esophageal reflux disease without esophagitis: Secondary | ICD-10-CM | POA: Insufficient documentation

## 2012-03-03 DIAGNOSIS — N816 Rectocele: Secondary | ICD-10-CM | POA: Insufficient documentation

## 2012-03-03 DIAGNOSIS — Z90710 Acquired absence of both cervix and uterus: Secondary | ICD-10-CM | POA: Insufficient documentation

## 2012-03-03 DIAGNOSIS — N736 Female pelvic peritoneal adhesions (postinfective): Secondary | ICD-10-CM | POA: Insufficient documentation

## 2012-03-03 DIAGNOSIS — Z79899 Other long term (current) drug therapy: Secondary | ICD-10-CM | POA: Insufficient documentation

## 2012-03-03 DIAGNOSIS — R109 Unspecified abdominal pain: Secondary | ICD-10-CM | POA: Insufficient documentation

## 2012-03-03 DIAGNOSIS — R350 Frequency of micturition: Secondary | ICD-10-CM | POA: Insufficient documentation

## 2012-03-03 DIAGNOSIS — N3946 Mixed incontinence: Secondary | ICD-10-CM | POA: Insufficient documentation

## 2012-03-03 HISTORY — DX: Personal history of other diseases of the digestive system: Z87.19

## 2012-03-03 HISTORY — DX: Frequency of micturition: R35.0

## 2012-03-03 HISTORY — DX: Nocturia: R35.1

## 2012-03-03 HISTORY — DX: Urgency of urination: R39.15

## 2012-03-03 HISTORY — PX: VAGINAL PROLAPSE REPAIR: SHX830

## 2012-03-03 HISTORY — DX: Headache, unspecified: R51.9

## 2012-03-03 HISTORY — DX: Headache: R51

## 2012-03-03 HISTORY — DX: Personal history of urinary calculi: Z87.442

## 2012-03-03 HISTORY — PX: LAPAROSCOPY: SHX197

## 2012-03-03 HISTORY — DX: Interstitial cystitis (chronic) without hematuria: N30.10

## 2012-03-03 HISTORY — PX: LAPAROSCOPIC LYSIS OF ADHESIONS: SHX5905

## 2012-03-03 HISTORY — DX: Bell's palsy: G51.0

## 2012-03-03 HISTORY — DX: Other chronic pain: G89.29

## 2012-03-03 SURGERY — LAPAROSCOPY OPERATIVE
Anesthesia: General | Site: Vagina | Wound class: Clean

## 2012-03-03 MED ORDER — LIDOCAINE HCL (CARDIAC) 20 MG/ML IV SOLN
INTRAVENOUS | Status: DC | PRN
  Administered 2012-03-03: 50 mg via INTRAVENOUS

## 2012-03-03 MED ORDER — ACETAMINOPHEN 10 MG/ML IV SOLN
1000.0000 mg | Freq: Four times a day (QID) | INTRAVENOUS | Status: AC
  Administered 2012-03-03: 1000 mg via INTRAVENOUS
  Filled 2012-03-03: qty 100

## 2012-03-03 MED ORDER — ONDANSETRON HCL 4 MG/2ML IJ SOLN
INTRAMUSCULAR | Status: DC | PRN
  Administered 2012-03-03: 4 mg via INTRAVENOUS

## 2012-03-03 MED ORDER — SODIUM CHLORIDE 0.9 % IR SOLN
Status: DC | PRN
  Administered 2012-03-03: 09:00:00

## 2012-03-03 MED ORDER — LACTATED RINGERS IV SOLN
INTRAVENOUS | Status: DC
  Administered 2012-03-03: 09:00:00 via INTRAVENOUS
  Administered 2012-03-03: 100 mL/h via INTRAVENOUS
  Filled 2012-03-03: qty 1000

## 2012-03-03 MED ORDER — DIPHENHYDRAMINE HCL 50 MG/ML IJ SOLN
12.5000 mg | Freq: Four times a day (QID) | INTRAMUSCULAR | Status: DC | PRN
  Filled 2012-03-03: qty 0.5

## 2012-03-03 MED ORDER — GLYCOPYRROLATE 0.2 MG/ML IJ SOLN
INTRAMUSCULAR | Status: DC | PRN
  Administered 2012-03-03: 0.2 mg via INTRAVENOUS

## 2012-03-03 MED ORDER — BELLADONNA ALKALOIDS-OPIUM 16.2-60 MG RE SUPP
RECTAL | Status: DC | PRN
  Administered 2012-03-03: 1 via RECTAL

## 2012-03-03 MED ORDER — MIDAZOLAM HCL 5 MG/5ML IJ SOLN
INTRAMUSCULAR | Status: DC | PRN
  Administered 2012-03-03: 2 mg via INTRAVENOUS

## 2012-03-03 MED ORDER — ROCURONIUM BROMIDE 100 MG/10ML IV SOLN
INTRAVENOUS | Status: DC | PRN
  Administered 2012-03-03: 50 mg via INTRAVENOUS

## 2012-03-03 MED ORDER — HYDROMORPHONE HCL PF 1 MG/ML IJ SOLN
0.5000 mg | INTRAMUSCULAR | Status: DC | PRN
  Filled 2012-03-03: qty 1

## 2012-03-03 MED ORDER — CIPROFLOXACIN HCL 500 MG PO TABS: 500.0000 mg | ORAL_TABLET | Freq: Two times a day (BID) | ORAL | Status: DC

## 2012-03-03 MED ORDER — BUPIVACAINE-EPINEPHRINE 0.5% -1:200000 IJ SOLN: INTRAMUSCULAR | Status: DC | PRN

## 2012-03-03 MED ORDER — URELLE 81 MG PO TABS
1.0000 | ORAL_TABLET | Freq: Three times a day (TID) | ORAL | Status: DC
Start: 2012-03-03 — End: 2012-06-10

## 2012-03-03 MED ORDER — PROMETHAZINE HCL 25 MG/ML IJ SOLN
6.2500 mg | INTRAMUSCULAR | Status: DC | PRN
  Filled 2012-03-03: qty 1

## 2012-03-03 MED ORDER — ESTRADIOL 0.1 MG/GM VA CREA
TOPICAL_CREAM | VAGINAL | Status: DC | PRN
  Administered 2012-03-03: 1 via VAGINAL

## 2012-03-03 MED ORDER — BISACODYL 5 MG PO TBEC
5.0000 mg | DELAYED_RELEASE_TABLET | Freq: Every day | ORAL | Status: DC | PRN
  Filled 2012-03-03: qty 1

## 2012-03-03 MED ORDER — STERILE WATER FOR IRRIGATION IR SOLN
Status: DC | PRN
  Administered 2012-03-03: 10 mL

## 2012-03-03 MED ORDER — HYDROCODONE-ACETAMINOPHEN 5-325 MG PO TABS
1.0000 | ORAL_TABLET | ORAL | Status: DC | PRN
  Administered 2012-03-03: 1 via ORAL
  Administered 2012-03-03: 2 via ORAL
  Administered 2012-03-03: 1 via ORAL
  Filled 2012-03-03: qty 2

## 2012-03-03 MED ORDER — INDIGOTINDISULFONATE SODIUM 8 MG/ML IJ SOLN
INTRAMUSCULAR | Status: DC | PRN
  Administered 2012-03-03: 5 mL via INTRAVENOUS

## 2012-03-03 MED ORDER — LACTATED RINGERS IR SOLN
Status: DC | PRN
  Administered 2012-03-03: 3000 mL

## 2012-03-03 MED ORDER — SODIUM CHLORIDE 0.9 % IV SOLN
INTRAVENOUS | Status: DC | PRN
  Administered 2012-03-03: 10:00:00 via VAGINAL

## 2012-03-03 MED ORDER — ZOLPIDEM TARTRATE 5 MG PO TABS
5.0000 mg | ORAL_TABLET | Freq: Every evening | ORAL | Status: DC | PRN
  Filled 2012-03-03: qty 1

## 2012-03-03 MED ORDER — CEFOTETAN DISODIUM 2 G IJ SOLR
2.0000 g | INTRAMUSCULAR | Status: AC
  Administered 2012-03-03: 2 g via INTRAVENOUS
  Filled 2012-03-03: qty 2

## 2012-03-03 MED ORDER — ONDANSETRON HCL 4 MG/2ML IJ SOLN
4.0000 mg | INTRAMUSCULAR | Status: DC | PRN
Start: 2012-03-03 — End: 2012-03-04
  Filled 2012-03-03: qty 2

## 2012-03-03 MED ORDER — SODIUM CHLORIDE 0.45 % IV SOLN
INTRAVENOUS | Status: DC
  Administered 2012-03-03 – 2012-03-04 (×2): via INTRAVENOUS
  Filled 2012-03-03: qty 1000

## 2012-03-03 MED ORDER — SCOPOLAMINE 1 MG/3DAYS TD PT72
1.0000 | MEDICATED_PATCH | TRANSDERMAL | Status: DC
  Administered 2012-03-03: 1.5 mg via TRANSDERMAL

## 2012-03-03 MED ORDER — CEFAZOLIN SODIUM 1-5 GM-% IV SOLN
1.0000 g | INTRAVENOUS | Status: DC
  Filled 2012-03-03: qty 50

## 2012-03-03 MED ORDER — CIPROFLOXACIN HCL 500 MG PO TABS
500.0000 mg | ORAL_TABLET | Freq: Two times a day (BID) | ORAL | Status: DC
  Administered 2012-03-03 (×2): 500 mg via ORAL
  Filled 2012-03-03: qty 1

## 2012-03-03 MED ORDER — SODIUM CHLORIDE 0.9 % IJ SOLN: INTRAMUSCULAR | Status: DC | PRN

## 2012-03-03 MED ORDER — PROPOFOL 10 MG/ML IV BOLUS
INTRAVENOUS | Status: DC | PRN
  Administered 2012-03-03: 160 mg via INTRAVENOUS

## 2012-03-03 MED ORDER — DEXAMETHASONE SODIUM PHOSPHATE 4 MG/ML IJ SOLN
INTRAMUSCULAR | Status: DC | PRN
  Administered 2012-03-03: 10 mg via INTRAVENOUS

## 2012-03-03 MED ORDER — HYDROMORPHONE HCL PF 1 MG/ML IJ SOLN
0.2500 mg | INTRAMUSCULAR | Status: DC | PRN
  Filled 2012-03-03: qty 1

## 2012-03-03 MED ORDER — OXYBUTYNIN CHLORIDE 5 MG PO TABS
5.0000 mg | ORAL_TABLET | Freq: Three times a day (TID) | ORAL | Status: DC | PRN
  Filled 2012-03-03: qty 1

## 2012-03-03 MED ORDER — HYDROCODONE-ACETAMINOPHEN 7.5-650 MG PO TABS: 1.0000 | ORAL_TABLET | Freq: Four times a day (QID) | ORAL | Status: DC | PRN

## 2012-03-03 MED ORDER — BUPIVACAINE HCL (PF) 0.25 % IJ SOLN
INTRAMUSCULAR | Status: DC | PRN
  Administered 2012-03-03: 10 mL

## 2012-03-03 MED ORDER — SCOPOLAMINE 1 MG/3DAYS TD PT72
1.0000 | MEDICATED_PATCH | TRANSDERMAL | Status: DC
  Filled 2012-03-03: qty 1

## 2012-03-03 MED ORDER — FENTANYL CITRATE 0.05 MG/ML IJ SOLN
INTRAMUSCULAR | Status: DC | PRN
  Administered 2012-03-03 (×3): 50 ug via INTRAVENOUS

## 2012-03-03 SURGICAL SUPPLY — 89 items
APPLICATOR COTTON TIP 6IN STRL (MISCELLANEOUS) ×5 IMPLANT
BAG URINE DRAINAGE (UROLOGICAL SUPPLIES) ×10 IMPLANT
BANDAGE ADHESIVE 1X3 (GAUZE/BANDAGES/DRESSINGS) IMPLANT
BENZOIN TINCTURE PRP APPL 2/3 (GAUZE/BANDAGES/DRESSINGS) IMPLANT
BLADE SURG 10 STRL SS (BLADE) ×5 IMPLANT
BLADE SURG 11 STRL SS (BLADE) ×5 IMPLANT
BLADE SURG 15 STRL LF DISP TIS (BLADE) ×4 IMPLANT
BLADE SURG 15 STRL SS (BLADE) ×1
BOOTIES KNEE HIGH SLOAN (MISCELLANEOUS) ×5 IMPLANT
CANISTER SUCTION 1200CC (MISCELLANEOUS) IMPLANT
CANISTER SUCTION 2500CC (MISCELLANEOUS) ×5 IMPLANT
CATH FOLEY 2WAY SLVR  5CC 16FR (CATHETERS) ×2
CATH FOLEY 2WAY SLVR 5CC 16FR (CATHETERS) ×8 IMPLANT
CLOTH BEACON ORANGE TIMEOUT ST (SAFETY) ×5 IMPLANT
COVER MAYO STAND STRL (DRAPES) ×5 IMPLANT
COVER TABLE BACK 60X90 (DRAPES) ×5 IMPLANT
DERMABOND ADVANCED (GAUZE/BANDAGES/DRESSINGS) ×1
DERMABOND ADVANCED .7 DNX12 (GAUZE/BANDAGES/DRESSINGS) ×4 IMPLANT
DEVICE CAPIO SLIM SINGLE (INSTRUMENTS) ×5 IMPLANT
DISSECTOR ROUND CHERRY 3/8 STR (MISCELLANEOUS) IMPLANT
DRAPE CAMERA CLOSED 9X96 (DRAPES) ×5 IMPLANT
DRAPE UNDERBUTTOCKS STRL (DRAPE) ×5 IMPLANT
ELECT REM PT RETURN 9FT ADLT (ELECTROSURGICAL) ×5
ELECTRODE REM PT RTRN 9FT ADLT (ELECTROSURGICAL) ×4 IMPLANT
FLOSEAL 10ML (HEMOSTASIS) IMPLANT
FORCEPS CUTTING 33CM 5MM (CUTTING FORCEPS) IMPLANT
FORCEPS CUTTING 45CM 5MM (CUTTING FORCEPS) IMPLANT
GAUZE SPONGE 4X4 16PLY XRAY LF (GAUZE/BANDAGES/DRESSINGS) IMPLANT
GLOVE BIO SURGEON STRL SZ 6.5 (GLOVE) ×10 IMPLANT
GLOVE BIO SURGEON STRL SZ7.5 (GLOVE) ×15 IMPLANT
GLOVE BIO SURGEON STRL SZ8 (GLOVE) ×5 IMPLANT
GLOVE ECLIPSE 6.0 STRL STRAW (GLOVE) ×5 IMPLANT
GLOVE SURG ORTHO 8.0 STRL STRW (GLOVE) ×5 IMPLANT
GOWN PREVENTION PLUS LG XLONG (DISPOSABLE) ×15 IMPLANT
GOWN STRL REIN XL XLG (GOWN DISPOSABLE) ×5 IMPLANT
HOLDER FOLEY CATH W/STRAP (MISCELLANEOUS) ×5 IMPLANT
HOOK RETRACTION 12 ELAST STAY (MISCELLANEOUS) IMPLANT
MARKER PEN SURG W/LABELS BLK (STERILIZATION PRODUCTS) ×5 IMPLANT
NEEDLE 1/2 CIR CATGUT .05X1.09 (NEEDLE) ×5 IMPLANT
NEEDLE HYPO 22GX1.5 SAFETY (NEEDLE) ×10 IMPLANT
NEEDLE INSUFFLATION 14GA 120MM (NEEDLE) ×5 IMPLANT
NS IRRIG 500ML POUR BTL (IV SOLUTION) ×5 IMPLANT
PACK BASIN DAY SURGERY FS (CUSTOM PROCEDURE TRAY) ×5 IMPLANT
PACK LAPAROSCOPY II (CUSTOM PROCEDURE TRAY) ×5 IMPLANT
PACKING VAGINAL (PACKING) ×5 IMPLANT
PAD OB MATERNITY 4.3X12.25 (PERSONAL CARE ITEMS) ×5 IMPLANT
PAD PREP 24X48 CUFFED NSTRL (MISCELLANEOUS) ×5 IMPLANT
PENCIL BUTTON HOLSTER BLD 10FT (ELECTRODE) ×5 IMPLANT
PKS CUTTING FORCEP - GYRUS ×5 IMPLANT
PLUG CATH AND CAP STER (CATHETERS) ×5 IMPLANT
POUCH SPECIMEN RETRIEVAL 10MM (ENDOMECHANICALS) ×5 IMPLANT
RETRACTOR LONRSTAR 16.6X16.6CM (MISCELLANEOUS) ×4 IMPLANT
RETRACTOR STAY HOOK 5MM (MISCELLANEOUS) IMPLANT
RETRACTOR STER APS 16.6X16.6CM (MISCELLANEOUS) ×5
SCISSORS LAP 5X35 DISP (ENDOMECHANICALS) IMPLANT
SET IRRIG TUBING LAPAROSCOPIC (IRRIGATION / IRRIGATOR) ×5 IMPLANT
SET IRRIG Y TYPE TUR BLADDER L (SET/KITS/TRAYS/PACK) ×5 IMPLANT
SHEET LAVH (DRAPES) IMPLANT
SOLUTION ANTI FOG 6CC (MISCELLANEOUS) ×5 IMPLANT
SOLUTION ELECTROLUBE (MISCELLANEOUS) ×5 IMPLANT
SPONGE LAP 4X18 X RAY DECT (DISPOSABLE) ×10 IMPLANT
STRIP CLOSURE SKIN 1/4X4 (GAUZE/BANDAGES/DRESSINGS) IMPLANT
SUCTION FRAZIER TIP 10 FR DISP (SUCTIONS) ×5 IMPLANT
SUT ABS MONO DBL WITH NDL 48IN (SUTURE) IMPLANT
SUT CAPIO POLYGLYCOLIC (SUTURE) ×5 IMPLANT
SUT ETHILON 2 0 PS N (SUTURE) IMPLANT
SUT MON AB 2-0 SH 27 (SUTURE)
SUT MON AB 2-0 SH27 (SUTURE) IMPLANT
SUT NONABSORB MONO DB W/NDL 48 (SUTURE) IMPLANT
SUT VIC AB 0 CT1 36 (SUTURE) IMPLANT
SUT VIC AB 2-0 CT1 27 (SUTURE)
SUT VIC AB 2-0 CT1 TAPERPNT 27 (SUTURE) IMPLANT
SUT VIC AB 2-0 UR6 27 (SUTURE) ×45 IMPLANT
SUT VICRYL 0 UR6 27IN ABS (SUTURE) ×5 IMPLANT
SUT VICRYL RAPIDE 3 0 (SUTURE) ×5 IMPLANT
SYR BULB IRRIGATION 50ML (SYRINGE) ×5 IMPLANT
SYR CONTROL 10ML LL (SYRINGE) ×5 IMPLANT
SYRINGE 10CC LL (SYRINGE) ×5 IMPLANT
TISSUE REPAIR XENFORM 6X10CM (Tissue) ×5 IMPLANT
TOWEL OR 17X24 6PK STRL BLUE (TOWEL DISPOSABLE) ×10 IMPLANT
TRAY DSU PREP LF (CUSTOM PROCEDURE TRAY) ×5 IMPLANT
TROCAR Z-THREAD BLADED 11X100M (TROCAR) ×5 IMPLANT
TROCAR Z-THREAD BLADED 5X100MM (TROCAR) ×5 IMPLANT
TUBE CONNECTING 12X1/4 (SUCTIONS) ×5 IMPLANT
TUBING INSUFFLATION W/FILTER (TUBING) ×5 IMPLANT
WARMER LAPAROSCOPE (MISCELLANEOUS) ×5 IMPLANT
WATER STERILE IRR 1000ML POUR (IV SOLUTION) ×5 IMPLANT
WATER STERILE IRR 500ML POUR (IV SOLUTION) ×5 IMPLANT
YANKAUER SUCT BULB TIP NO VENT (SUCTIONS) IMPLANT

## 2012-03-03 NOTE — Op Note (Signed)
Pre-operative diagnosis : Posterior vaginal wall prolapse  Postoperative diagnosis: Same  Operation: Posterior vaginal wall prolapse repair using Xeroform biologic tissue and iliococcygeus repair  Surgeon:  S. Patsi Sears, MD  First assistant: None  [id number]}  Preparation: After appropriate preanesthesia, the patient was brought to the operating room, placed on the operating table in the dorsal supine position where general endotracheal anesthesia was introduced. She was replaced in the dorsal lithotomy position where the abdomen was prepped and draped in usual fashion. The patient underwent laparoscopic evaluation per Dr. Rana Snare.  Following exploration per Dr. Rana Snare, a second timeout was observed, and vaginal examination showed large posterior prolapse. The patient had previous he had rectocele repair, and a large amount of scarring was noted. Examination showed that the patient had previous anterior vault suspension, and this appeared to be in good repair. There was no cystocele noted. The urethra was not hypermobile. There was a large high rectocele, however.  Review history:History of Present Illness  61 yo female, S/P Uphold on 04/05/11. Posterior Pinnacle was not done because after the Uphold was done, the rectocele was very minimal. She now  complains of frequency & feels like there is still some swelling in the vagina area.  Interval HX:  She  has significant urgency/frequency and dysuria. Does admit to drinking large amount of Kool-aide daily. Is no longer using Premain vaginal HRT cream. No hematuria. Is unable to tolerate Elmiron po due to vaginal rash/pain. She now has significant posterior vaginal wall prolapse, and, after counseling, will have ileococcygeus repair with Xenform biologic tissue.      Statement of  Likelihood of Success: Excellent. TIME-OUT observed.:  Procedure: Using Marcaine 25 with epinephrine 1 200,000, hydrodissection was accomplished after marking  out area of incision. Triangular perineal incision was then accomplished, and scar tissue was incised where previous rectocele incision was made. Incision was accomplished to within 2 cm of the apex. The patient had previously had Level One repair, and did not need sacrospinous fixation today.  The large high rectocele was dissected. Was quite difficult to dissect distalward because of her previous rectocele repair. Rectal examination revealed no evidence of any rectal problem with dissection.  Following dissection, a plication was accomplished using 2-0 Vicryl suture. Was quite difficult to obtain plication at the proximal end of the rectocele. I did not see an enterocele sac, however.  Following rectocele plication, a large piece of Xeroform was cut to measurement, 7 cm long, and 4 cm wide. Using the U. device, polypropylene sutures were placed in the iliococcygeus muscle bilaterally, and then through the Xeroform biologic tissue. This was then sutured in place, and the dissected posterior vaginal wall was sutured over the biologic tissue. This was accomplished with continuous 2-0 Vicryl suture. The final centimeter of closure was accomplished with interrupted 2-0 Vicryl suture. The patient received IV Toradol at the beginning the case, and it indicates, cystoscopy was accomplished after she received IV indigo carmine. Blue urine was obtained, and urine was observed from each ureteral orifice. This was accomplished with both the 12 and the 70 lenses. The patient was awakened and taken to recovery room in good condition.

## 2012-03-03 NOTE — Procedures (Signed)
Preop RLQ, pelvic cyst Postop Same plus pelvic and abd adhesions Procedure laporoscopic LOA Surgeon:   Rana Snare Anesthesia:  GET EBL  Minimal Drains:  Foley No complications Findings:  Moderate adhesions of omentum to Ant abd wall, Small and large bowel adhesions in cul de sac.  Bil ovaries not seen after LOA DL

## 2012-03-03 NOTE — H&P (Signed)
cc: Dr. Rana Snare cc; Dr. Sherril Croon   Active Problems Problems  1. Abdominal Pain 789.00 2. Acute Cystitis 595.0 3. Chronic Interstitial Cystitis 595.1 4. Female Stress Incontinence 625.6 5. Flank Pain Right 6. Post-operative Hematoma 998.12 7. Urge Incontinence Of Urine 788.31 8. Urinary Frequency 788.41 9. Vaginal Mucosa Atrophy 10. Vaginal Pain 625.8 11. Vaginal Sling Operation For Stress Incontinence 12. Vaginal Wall Prolapse With Midline Cystocele 618.01 13. Vaginal Wall Prolapse With Rectocele 618.04  History of Present Illness  61 yo female,   S/P Uphold on 04/05/11.  Posterior Pinnacle was not done because after the Uphold was done, the rectocele was very minimal.  She complains of frequency & feels like there is still some swelling in the vagina area.   Interval HX:  Today has significant urgency/frequency and dysuria. Does admit to drinking large amount of Kool-aide daily. Is no longer using Premain vaginal HRT cream. No hematuria. Is unable to tolerate Elmiron po due to vaginal rash/pain.   Past Medical History Problems  1. History of  Arthritis V13.4 2. History of  Central Pain Syndrome 338.0 3. History of  Esophageal Reflux 530.81 4. History of  Fibromyalgia 729.1 5. History of  Nephrolithiasis V13.01 6. History of  Osteoporosis 733.00  Surgical History Problems  1. History of  Anterior Colporrhaphy, Repair Of Cystocele 2. History of  Bladder Surgery 3. History of  Cystoscopy With Manipulation Of Ureteral Calculus 4. History of  Gynecologic Surgery 5. History of  Lithotripsy 6. History of  Nasal Septal Deviation Repair 7. History of  Sacrospinous Ligament Fixation For Posthysterectomy Prolapse 8. History of  Shoulder Surgery 9. History of  Simple Bunion Exostectomy (Silver Procedure) 10. History of  Total Abdominal Hysterectomy With Removal Of Left Ovary 11. Vaginal Sling Operation For Stress Incontinence 12. History of  Vaginal Surg Insertion Of Mesh For Pelvic  Floor Repair  Current Meds 1. Baclofen TABS; Therapy: (Recorded:02Aug2013) to 2. Calcitonin (Salmon) 200 UNIT/ACT Nasal Solution; Therapy: (Recorded:27Aug2012) to 3. Calcium + D TABS; Therapy: (Recorded:27Aug2012) to 4. Fish Oil CAPS; Therapy: (Recorded:27Aug2012) to 5. Flonase 50 MCG/ACT Nasal Suspension; Therapy: (Recorded:27Aug2012) to 6. Multiple Vitamin TABS; Therapy: (Recorded:27Aug2012) to 7. Neurontin 300 MG Oral Capsule; Therapy: (Recorded:27Aug2012) to 8. NexIUM 40 MG Oral Capsule Delayed Release; Therapy: (Recorded:27Aug2012) to 9. Premarin 0.625 MG/GM Vaginal Cream; Apply 1 inch nightly to vaginal opening area; Therapy:  27Aug2012 to (Evaluate:06Jun2014); Last Rx:11Jun2013 10. Vitamin C 500 MG Oral Tablet; Therapy: (Recorded:27Aug2012) to 11. Vitamin D3 5000 UNIT Oral Tablet; Therapy: (Recorded:27Aug2012) to  Allergies Medication  1. Bactrim TABS 2. Elmiron CAPS 3. Enablex TB24  Family History Problems  1. Family history of  Family Health Status Number Of Children 2. Family history of  Father Deceased At Age 59 3. Sororal history of  Heart Disease V17.49 4. Family history of  Mother Deceased At Age 70 5. Maternal history of  Non-Hodgkin's Lymphoma 6. Maternal history of  Transient Ischemic Attack  Social History Problems    Former Smoker V15.82   Marital History - Currently Married   Occupation: Denied    History of  Alcohol Use   History of  Caffeine Use  Review of Systems Constitutional and gastrointestinal system(s) were reviewed and pertinent findings if present are noted.  Genitourinary: urinary frequency, urinary urgency, dysuria, nocturia, incontinence, urinary hesitancy, urinary stream starts and stops, incomplete emptying of bladder, suprapubic pain and dyspareunia, but no hematuria, no urethral discharge, no foul smelling urine, urine not cloudy, no oliguria, no vaginal discharge, no pelvic  pain, no perineal pain, no inguinal pain, no inguinal  swelling and no genital lesions.  Gastrointestinal: constipation, but no nausea, no vomiting, no flank pain, no abdominal pain and no heartburn.    Vitals Vital Signs [Data Includes: Last 1 Day]  02Aug2013 03:21PM  Blood Pressure: 115 / 71 Temperature: 98.6 F Heart Rate: 65  Physical Exam Constitutional: Well nourished and well developed . No acute distress. The patient appears well hydrated.  ENT:. The ears and nose are normal in appearance.  Pulmonary: No respiratory distress.  Cardiovascular:. No peripheral edema.  Abdomen: The abdomen is soft and nontender. No masses are palpated. No CVA tenderness. No hernias are palpable. No hepatosplenomegaly noted.  Genitourinary:  Chaperone Present: lauren.  Examination of the external genitalia shows vulvar atrophy, but normal female external genitalia. The urethra is normal in appearance, not tender and no urethral caruncle. There is a urethral mass. Urethral hypermobility is not present. There is no urethral discharge. There is no urethral prolapse. Vaginal exam demonstrates atrophy and the vaginal epithelium to be poorly estrogenized, but no discharge and no tenderness. No cystocele is identified. No enterocele is identified. A rectocele is present with a midline defect (grade 3 /4).  Stage II. The cervix is is absent. The uterus is absent. The bladder is normal on palpation. The anus is normal on inspection.  Lymphatics: The femoral and inguinal nodes are not enlarged or tender.  Skin: Normal skin turgor, no visible rash and no visible skin lesions.  Neuro/Psych:. Mood and affect are appropriate.    Results/Data Urine [Data Includes: Last 1 Day]   02Aug2013  COLOR YELLOW   APPEARANCE CLEAR   SPECIFIC GRAVITY 1.010   pH 8.0   GLUCOSE NEG mg/dL  BILIRUBIN NEG   KETONE NEG mg/dL  BLOOD NEG   PROTEIN NEG mg/dL  UROBILINOGEN 0.2 mg/dL  NITRITE NEG   LEUKOCYTE ESTERASE NEG    Assessment Assessed  1. Vaginal Wall Prolapse With  Rectocele 618.04      Pelvic floor relaxation with rectocele formation. Increase vaginal estrogen to q night through he surgery.   Plan Vaginal Wall Prolapse With Rectocele (618.04)  1. Pelvic Exam  Requested for: 02Aug2013 2. Follow-up Schedule Surgery Office  Follow-up  Requested for: 02Aug2013      Schedule ileococcygeus repair with Xenform.  Take Miralax.  Schedule surgery.   Signatures Electronically signed by : Jethro Bolus, M.D.; Nov 30 2011  4:19PM

## 2012-03-03 NOTE — Anesthesia Procedure Notes (Addendum)
Procedure Name: Intubation Date/Time: 03/03/2012 8:02 AM Performed by: Maris Berger T Pre-anesthesia Checklist: Patient identified, Emergency Drugs available, Suction available and Patient being monitored Patient Re-evaluated:Patient Re-evaluated prior to inductionOxygen Delivery Method: Circle System Utilized Preoxygenation: Pre-oxygenation with 100% oxygen Intubation Type: IV induction Ventilation: Mask ventilation without difficulty Laryngoscope Size: 3 and Mac Grade View: Grade I Tube type: Oral Tube size: 7.0 mm Number of attempts: 1 Airway Equipment and Method: stylet Placement Confirmation: ETT inserted through vocal cords under direct vision,  positive ETCO2 and breath sounds checked- equal and bilateral Secured at: 21 cm Tube secured with: Tape Dental Injury: Teeth and Oropharynx as per pre-operative assessment    Performed by: Briant Sites

## 2012-03-03 NOTE — Progress Notes (Signed)
Urology Progress Note  Day of Surgery   Subjective: Post op high rectocele repair. No complaints.     No acute urologic events overnight. Ambulation:   positive Flatus:    negative Bowel movement  negative  Pain: some relief  Objective:  Blood pressure 108/67, pulse 65, temperature 97.3 F (36.3 C), temperature source Oral, resp. rate 18, height 5\' 2"  (1.575 m), weight 62.596 kg (138 lb), SpO2 95.00%.  Physical Exam:  General:  No acute distress, awake Resp: clear to auscultation bilaterally Genitourinary:  Normal BUS Foley:in position    I/O last 3 completed shifts: In: 2860 [P.O.:1060; I.V.:1800] Out: 1175 [Urine:1175]  Recent Labs  [facility] 03/03/12 0646   HGB 13.0   WBC --   PLT --    No results found for this basename: NA:2,K:2,CL:2,CO2:2,BUN:2,CREATININE:2,CALCIUM:2,MAGNESIUM:2,GFRNONAA:2,GFRAA:2 in the last 72 hours   No results found for this basename: PT:2,INR:2,APTT:2 in the last 72 hours   No components found with this basename: ABG:2  Assessment/Plan:  Catheter not removed. Continue any current medications. Pt is to increase activities as tolerated. D/c in AM after voiding trial

## 2012-03-03 NOTE — Interval H&P Note (Signed)
History and Physical Interval Note:  03/03/2012 11:03 AM  [name]  has presented today for surgery, with the diagnosis of RIGHT LOWER QUADRANT PAIN AND OVARIAN CYST  The various methods of treatment have been discussed with the patient and family. After consideration of risks, benefits and other options for treatment, the patient has consented to  Procedure(s) (LRB) with comments: LAPAROSCOPY OPERATIVE (N/A) LAPAROSCOPIC LYSIS OF ADHESIONS (N/A) VAGINAL VAULT SUSPENSION (N/A) - repair with xenform with ileococcygeus repair as a surgical intervention .  The patient's history has been reviewed, patient examined, no change in status, stable for surgery.  I have reviewed the patient's chart and labs.  Questions were answered to the patient's satisfaction.     Jethro Bolus I

## 2012-03-03 NOTE — Op Note (Signed)
[name], [name]               ACCOUNT NO.:  [id number]  MEDICAL RECORD NO.:  [id number]  LOCATION:                               FACILITY:  [facility]  PHYSICIAN:  Dineen Kid. Rana Snare, M.D.    DATE OF BIRTH:  18-Mar-1951  DATE OF PROCEDURE:  03/03/2012 DATE OF DISCHARGE:  03/04/2012                              OPERATIVE REPORT   SURGEON:  Dineen Kid. Rana Snare, M.D.  ANESTHESIA:  General endotracheal.  PREOPERATIVE DIAGNOSIS:  Pelvic pain, right lower quadrant pain, and pelvic cyst on CT and ultrasound.  The patient also presents for combined procedure with Dr. Patsi Sears for rectocele repair.  POSTOPERATIVE DIAGNOSIS:  Pelvic pain, pelvic adhesions, no ovaries visualized.  PROCEDURE:  Operative laparoscopy with moderate lysis of adhesions of bowel and omental adhesions.  INDICATIONS:  [name] is a 61 year old G4, P3.  She has had hysterectomy years ago, presumably still has her tubes and ovaries.  She has had right lower quadrant pain, was scheduled for a rectocele repair and ultrasound.  Back in May that showed a 2.1-cm cyst with clear fluid in the right adnexal area.  She had normal CA-125.  She has postmenopausal and has intermittent right lower quadrant pain.  She desires to have surgical evaluation of the abdomen, pelvis and both ovaries removed.  Risks and benefits of the procedure were discussed at length.  Informed consent was obtained.  FINDINGS AT THE TIME OF SURGERY:  Moderate amount of adhesions and omentum to the anterior abdominal wall, small bowel to the right side of the cul-de-sac and large bowel adhesed to the left side of the cul-de- sac.  After removal of the adhesions, no deliveries were visualized at the level of the round ligament and in the infundibulopelvic ligament, normal-appearing liver and gallbladder.  DESCRIPTION OF PROCEDURE:  After adequate analgesia, the patient was placed in dorsal lithotomy position.  She was sterilely prepped and draped.   Bladder was sterilely drained with a Foley catheter.  A 1 cm infraumbilical skin incision was made.  A Veress needle was inserted and the abdomen was insufflated with dullness to percussion.  An 11-mm trocar was inserted.  The above findings were noted by laparoscope.  A 5- mm trocar was inserted left of the midline 2 fingerbreadths above the pubic symphysis.  After careful and systematic evaluation of the abdomen and pelvis, Gyrus cutting forceps was used to ligate and dissect the omentum from the anterior abdominal wall.  Good hemostasis was achieved. The small bowel was carefully dissected from the right pelvic sidewall. Care was taken to avoid the underlying ureter, vessels and avoid injury to the bowel.  After the small bowel was removed from the pelvic sidewall, the ureter was identified.  The infundibulopelvic ligament and round ligament pedicles had been visualized, and there was no evidence of any type of ovary noted at this site.  The large bowel and omentum and epiploica were dissected from the left pelvic sidewall.  Again, care was taken to avoid the bowel achieved good hemostasis and avoid injury to the ureter and pelvic sidewall.  At the level of the round and infundibulopelvic ligament vessels, there was no evidence of left ovary.  Additional suction irrigator was used to irrigate the abdomen and pelvis.  Reexamination revealed no apparent ovaries seen, there had been a moderate amount of adhesions in that area, all which had been taken down carefully to the posterior cul-de-sac without evidence of neoplasm or ovaries identified.  At this time, the abdomen was desufflated. Trocar was removed.  The infraumbilical skin incision was closed with 0 Vicryl interrupted suture, the fascia with 3-0 Vicryl Rapide subcuticular suture.  The 5-mm site was closed with 3-0 Vicryl Rapide subcuticular suture and Dermabond.  The incision was injected with 0.25% Marcaine, total of 10 mL used.   The patient was stable at the end of the procedure over to Dr. Patsi Sears with minimal blood loss.  The patient received 2 g of cefotetan preoperatively.  Will stay overnight with Dr. Patsi Sears and will follow up with me in 2-3 weeks in the office.     Dineen Kid Rana Snare, M.D.     DCL/MEDQ  D:  03/03/2012  T:  03/03/2012  Job:  [id number]

## 2012-03-03 NOTE — Anesthesia Postprocedure Evaluation (Signed)
  Anesthesia Post-op Note  Patient: [name]  Procedure(s) Performed: Procedure(s) (LRB): LAPAROSCOPY OPERATIVE (N/A) LAPAROSCOPIC LYSIS OF ADHESIONS (N/A) VAGINAL VAULT SUSPENSION (N/A)  Patient Location: PACU  Anesthesia Type: General  Level of Consciousness: awake and alert   Airway and Oxygen Therapy: Patient Spontanous Breathing  Post-op Pain: mild  Post-op Assessment: Post-op Vital signs reviewed, Patient's Cardiovascular Status Stable, Respiratory Function Stable, Patent Airway and No signs of Nausea or vomiting  Post-op Vital Signs: stable  Complications: No apparent anesthesia complications

## 2012-03-03 NOTE — Transfer of Care (Signed)
Immediate Anesthesia Transfer of Care Note  Patient: [name]  Procedure(s) Performed: Procedure(s) (LRB) with comments: LAPAROSCOPY OPERATIVE (N/A) LAPAROSCOPIC LYSIS OF ADHESIONS (N/A) VAGINAL VAULT SUSPENSION (N/A) - repair with xenform with ileococcygeus repair  Patient Location: PACU  Anesthesia Type:General  Level of Consciousness: sedated and responds to stimulation  Airway & Oxygen Therapy: Patient Spontanous Breathing and Patient connected to nasal cannula oxygen  Post-op Assessment: Report given to PACU RN  Post vital signs: Reviewed and stable  Complications: No apparent anesthesia complications

## 2012-03-03 NOTE — Progress Notes (Signed)
Assessment completed: Pt alert able to verbalise needs. Resp even and non labored. Abdomen soft non tender B.S positive all four quads. Denies  Any pain or discomfort spouse at bedside.

## 2012-03-04 NOTE — Progress Notes (Signed)
Foley catheter removed as ordered. Vaginal packing removed. No active bleeding noted  Patient tolerated well.

## 2012-03-06 ENCOUNTER — Encounter (HOSPITAL_BASED_OUTPATIENT_CLINIC_OR_DEPARTMENT_OTHER): Payer: Self-pay

## 2012-03-11 ENCOUNTER — Encounter (HOSPITAL_BASED_OUTPATIENT_CLINIC_OR_DEPARTMENT_OTHER): Payer: Self-pay | Admitting: Obstetrics and Gynecology

## 2012-06-10 ENCOUNTER — Encounter: Payer: Self-pay | Admitting: Gastroenterology

## 2012-06-10 ENCOUNTER — Ambulatory Visit (INDEPENDENT_AMBULATORY_CARE_PROVIDER_SITE_OTHER): Payer: Medicare HMO | Admitting: Gastroenterology

## 2012-06-10 VITALS — BP 104/70 | HR 68 | Ht 61.5 in | Wt 138.4 lb

## 2012-06-10 DIAGNOSIS — R1013 Epigastric pain: Secondary | ICD-10-CM

## 2012-06-10 DIAGNOSIS — K3189 Other diseases of stomach and duodenum: Secondary | ICD-10-CM

## 2012-06-10 NOTE — Patient Instructions (Addendum)
You will be set up for an upper endoscopy for dyspepsia, abd pains. Stay on twice daily nexium for now. Start taking pepcid one pill at bedtime every night.

## 2012-06-10 NOTE — Progress Notes (Signed)
Review of pertinent gastrointestinal problems: 1. Routine risk for colon cancer:Colonsocopy 03/2008 Dr. Karilyn Cota (for lower abd pain, IDA): normal colonoscopy  2. Chronic abd pain EGD 03/2008 Dr. Karilyn Cota (for abd pain, IDA): 2cm HH, otherwise normal; duodenum biopsies normal 3. Gastroparesis based on slow emptying GES 03/2011, I recommended small, frequent meals, she cancelled follow up in 2012 and did not reschedule in 2014, February. 4. Intra abdominal adhesions, rectocele, s/p laparascopy 02/2012 FINDINGS AT THE TIME OF SURGERY: Moderate amount of adhesions and omentum to the anterior abdominal wall, small bowel to the right side of the cul-de-sac and large bowel adhesed to the left side of the cul-de- sac.    HPI: This is a  pleasant 62 year old woman who is here with her daughter today. I last saw her a little over a year ago.  Her stomach bothers her.  Has intermittent aspiration of food into lungs, per her, several times.    Sleeps on a wedge at night, laying on left.    When she sleeps on the right she has a lot of coughing. Coughs laying on right.  Really only coughs at night.  Very rare nsaids.  Belching, chest pains, stabbinhg pains,   She takes gas ex at night.  Takes nexium in AM usually before BF and PM before dinner.  Takes gas ex tid.  Bloating worse if she avoids gas ex.  Stopped gluten and may help her feel better a bit.  Early satiety, chronic.  She tells me she has had problems with her stomach for many many years and they seem to be getting worse. She has not had an upper colonoscopy and she also asked about having a CAT scan to look at her intestines and her pancreas and her spleen.  She feels she has lost about 10 pounds.       Past Medical History  Diagnosis Date  . GERD (gastroesophageal reflux disease)   . PONV (postoperative nausea and vomiting)   . Anxiety     hx panic attacks;  no meds  . History of kidney stones   . Facial nerve palsy RIGHT SIDE --  CHRONIC PAIN  . Interstitial cystitis   . Frequency of urination   . Urgency of urination   . Nocturia   . Rectocele   . Right lower quadrant abdominal pain   . Arthritis NECK AND SHOULDERS  . Chronic facial pain SECONDARY TO NERVE PALSY--  TAKES GABAPENTIN  . H/O hiatal hernia     Past Surgical History  Procedure Laterality Date  . Bunionectomy  SEPT 2005    LEFT FOOT  . Pubovaginal sling  04/05/2011    Procedure: Leonides Grills;  Surgeon: Kathi Ludwig, MD;  Location: [facility];  Service: Urology;  Laterality: N/A;  ANTERIOR UPHOLD LITE   AND CYSTOSCOPY  . Abdominal hysterectomy  1987    AND BURCH PROCEDURE  (BLADDER SUSPENSION)  . Tubal ligation  1980  . Nasal sinus surgery  DEC 2009  . Breast reduction surgery  2009    BILATERAL  . Attempted ureteroscopic stone extraction  2003  . Extracorporeal shock wave lithotripsy  2002    RIGHT SIDE  . Right shoulder surgery  DEC 2005  . Bladder sling procedure  2005  . Laparoscopy  03/03/2012    Procedure: LAPAROSCOPY OPERATIVE;  Surgeon: Turner Daniels, MD;  Location: [facility];  Service: Gynecology;  Laterality: N/A;  . Laparoscopic lysis of adhesions  03/03/2012  Procedure: LAPAROSCOPIC LYSIS OF ADHESIONS;  Surgeon: Turner Daniels, MD;  Location: [facility];  Service: Gynecology;  Laterality: N/A;  . Vaginal prolapse repair  03/03/2012    Procedure: VAGINAL VAULT SUSPENSION;  Surgeon: Kathi Ludwig, MD;  Location: [facility];  Service: Urology;  Laterality: N/A;  repair with xenform with ileococcygeus repair    Current Outpatient Prescriptions  Medication Sig Dispense Refill  . acetaminophen (TYLENOL) 325 MG tablet Take 650 mg by mouth every 6 (six) hours as needed.      . calcitonin, salmon, (MIACALCIN/FORTICAL) 200 UNIT/ACT nasal spray Place 1 spray into the nose daily.       . Cholecalciferol (VITAMIN D-3) 5000 UNITS TABS Take 1 capsule by mouth  daily.      Marland Kitchen conjugated estrogens (PREMARIN) vaginal cream Place vaginally 2 (two) times a week.       . esomeprazole (NEXIUM) 40 MG capsule Take 40 mg by mouth 2 (two) times daily.       . fluticasone (FLONASE) 50 MCG/ACT nasal spray Place 2 sprays into the nose as needed.       . gabapentin (NEURONTIN) 300 MG capsule Take 300 mg by mouth 3 (three) times daily.       . Multiple Vitamins-Minerals (MULTIVITAMIN WITH MINERALS) tablet Take 1 tablet by mouth daily.       . Omega-3 Fatty Acids (FISH OIL) 1200 MG CAPS Take 4 capsules by mouth daily.      . pentosan polysulfate (ELMIRON) 100 MG capsule Take 100 mg by mouth 2 (two) times daily.      . Polyethylene Glycol 3350 (MIRALAX PO) Take by mouth daily.      Marland Kitchen URELLE (URELLE/URISED) 81 MG TABS Take 1 tablet by mouth 3 (three) times daily as needed.      . vitamin C (ASCORBIC ACID) 500 MG tablet Take 500 mg by mouth daily.        No current facility-administered medications for this visit.    Allergies as of 06/10/2012 - Review Complete 06/10/2012  Allergen Reaction Noted  . Milk-related compounds Other (See Comments) 04/02/2011  . Bactrim Hives 01/08/2011  . Sulfa antibiotics  06/10/2012    Family History  Problem Relation Age of Onset  . Lymphoma Mother   . Heart disease Father     History   Social History  . Marital Status: Married    Spouse Name: N/A    Number of Children: 3  . Years of Education: N/A   Occupational History  . Homemaker    Social History Main Topics  . Smoking status: Former Smoker -- 10 years    Quit date: 05/01/1975  . Smokeless tobacco: Never Used  . Alcohol Use: No  . Drug Use: No  . Sexually Active: Yes   Other Topics Concern  . Not on file   Social History Narrative  . No narrative on file      Physical Exam: BP 104/70  Pulse 68  Ht 5' 1.5" (1.562 m)  Wt 138 lb 6 oz (62.766 kg)  BMI 25.73 kg/m2 Constitutional: generally well-appearing Psychiatric: alert and oriented x3 Abdomen:  soft, nontender, nondistended, no obvious ascites, no peritoneal signs, normal bowel sounds     Assessment and plan: 62 y.o. female with chronic abdominal discomforts, worsening GERD, intermittent regurgitation  Really think many of her complaints are functional. She did have documented gastroparesis previously and that certainly explain her bloating, early satiety, nausea. I don't think she  needs a colonoscopy or CAT scan and she was hoping to get. His don't see clear indication for either of those at this point. I did offer EGD or repeat check her hiatal hernia, gastritis, H. pylori. We'll proceed with at her soonest convenience. In the meantime she will stay on twice proton pump inhibitor and we'll start nightly H2 blocker.

## 2012-06-11 ENCOUNTER — Encounter: Payer: Self-pay | Admitting: Gastroenterology

## 2012-07-07 ENCOUNTER — Ambulatory Visit (AMBULATORY_SURGERY_CENTER): Payer: Medicare HMO | Admitting: Gastroenterology

## 2012-07-07 ENCOUNTER — Encounter: Payer: Self-pay | Admitting: Gastroenterology

## 2012-07-07 VITALS — BP 116/73 | HR 63 | Temp 98.4°F | Resp 20 | Ht 61.5 in | Wt 138.0 lb

## 2012-07-07 DIAGNOSIS — D131 Benign neoplasm of stomach: Secondary | ICD-10-CM

## 2012-07-07 DIAGNOSIS — R1013 Epigastric pain: Secondary | ICD-10-CM

## 2012-07-07 DIAGNOSIS — K2990 Gastroduodenitis, unspecified, without bleeding: Secondary | ICD-10-CM

## 2012-07-07 MED ORDER — SODIUM CHLORIDE 0.9 % IV SOLN: 500.0000 mL | INTRAVENOUS | Status: DC

## 2012-07-07 NOTE — Progress Notes (Addendum)
Patient did not have preoperative order for IV antibiotic SSI prophylaxis. (G8918)  Patient did not experience any of the following events: a burn prior to discharge; a fall within the facility; wrong site/side/patient/procedure/implant event; or a hospital transfer or hospital admission upon discharge from the facility. (G8907)  

## 2012-07-07 NOTE — Patient Instructions (Addendum)
YOU HAD AN ENDOSCOPIC PROCEDURE TODAY AT THE  ENDOSCOPY CENTER: Refer to the procedure report that was given to you for any specific questions about what was found during the examination.  If the procedure report does not answer your questions, please call your gastroenterologist to clarify.  If you requested that your care partner not be given the details of your procedure findings, then the procedure report has been included in a sealed envelope for you to review at your convenience later.  YOU SHOULD EXPECT: Some feelings of bloating in the abdomen. Passage of more gas than usual.  Walking can help get rid of the air that was put into your GI tract during the procedure and reduce the bloating.    DIET: Your first meal following the procedure should be a light meal and then it is ok to progress to your normal diet.  A half-sandwich or bowl of soup is an example of a good first meal.  Heavy or fried foods are harder to digest and may make you feel nauseous or bloated.  Likewise meals heavy in dairy and vegetables can cause extra gas to form and this can also increase the bloating.  Drink plenty of fluids but you should avoid alcoholic beverages for 24 hours.  ACTIVITY: Your care partner should take you home directly after the procedure.  You should plan to take it easy, moving slowly for the rest of the day.  You can resume normal activity the day after the procedure however you should NOT DRIVE or use heavy machinery for 24 hours (because of the sedation medicines used during the test).    SYMPTOMS TO REPORT IMMEDIATELY: A gastroenterologist can be reached at any hour.  During normal business hours, 8:30 AM to 5:00 PM Monday through Friday, call [phone].  After hours and on weekends, please call the GI answering service at [phone] who will take a message and have the physician on call contact you.   Following upper endoscopy (EGD)  Vomiting of blood or coffee ground  material  New chest pain or pain under the shoulder blades  Painful or persistently difficult swallowing  New shortness of breath  Fever of 100F or higher  Black, tarry-looking stools  We will call you if your H-pylori test is positive.  You will need to be on antibiotics.   Stay on your nexium.  FOLLOW UP: If any biopsies were taken you will be contacted by phone or by letter within the next 1-3 weeks.  Call your gastroenterologist if you have not heard about the biopsies in 3 weeks.  Our staff will call the home number listed on your records the next business day following your procedure to check on you and address any questions or concerns that you may have at that time regarding the information given to you following your procedure. This is a courtesy call and so if there is no answer at the home number and we have not heard from you through the emergency physician on call, we will assume that you have returned to your regular daily activities without incident.  SIGNATURES/CONFIDENTIALITY: You and/or your care partner have signed paperwork which will be entered into your electronic medical record.  These signatures attest to the fact that that the information above on your After Visit Summary has been reviewed and is understood.  Full responsibility of the confidentiality of this discharge information lies with you and/or your care-partner.

## 2012-07-07 NOTE — Op Note (Signed)
[facility] [address] [place], [address]   ENDOSCOPY PROCEDURE REPORT  PATIENT: Bonnie, Nguyen  MR#: [id number] BIRTHDATE: Sep 18, 1950 , 61  yrs. old GENDER: Female ENDOSCOPIST: Rachael Fee, MD PROCEDURE DATE:  07/07/2012 PROCEDURE:  EGD w/ biopsy ASA CLASS:     Class III INDICATIONS:  dyspepsia MEDICATIONS: Fentanyl 50 mcg IV, Versed 5 mg IV, and These medications were titrated to patient response per physician's verbal order TOPICAL ANESTHETIC: Cetacaine Spray  DESCRIPTION OF PROCEDURE: After the risks benefits and alternatives of the procedure were thoroughly explained, informed consent was obtained.  The LB-GIF Q180 [id number] endoscope was introduced through the mouth and advanced to the second portion of the duodenum. Without limitations.  The instrument was slowly withdrawn as the mucosa was fully examined.    There was mild, non-specific gastritis distally.  Biopsies taken and sent to pathology.  There was a small (1cm) hiatal hernia.  The examination was otherwise normal.  Retroflexed views revealed no abnormalities.     The scope was then withdrawn from the patient and the procedure completed.  COMPLICATIONS: There were no complications. ENDOSCOPIC IMPRESSION: There was mild, non-specific gastritis distally.  Biopsies taken and sent to pathology. There was a small (1cm) hiatal hernia. The examination was otherwise normal.  RECOMMENDATIONS: Await final biopsies.  If H.  pylori noted, you will be started on appropriate antibiotics.  Continue twice daily PPI (nexium) and bedtime pepcid or zantrac.   eSigned:  Rachael Fee, MD 07/07/2012 1:39 PM   CC: Loreen Freud, MD

## 2012-07-08 ENCOUNTER — Other Ambulatory Visit: Payer: Self-pay | Admitting: Urology

## 2012-07-08 ENCOUNTER — Telehealth: Payer: Self-pay

## 2012-07-08 NOTE — Telephone Encounter (Signed)
  Follow up Call-  Call back number 07/07/2012  Post procedure Call Back phone  # [phone]  Permission to leave phone message Yes     Patient questions:  Do you have a fever, pain , or abdominal swelling? no Pain Score  0 *  Have you tolerated food without any problems? yes  Have you been able to return to your normal activities? yes  Do you have any questions about your discharge instructions: Diet   no Medications  no Follow up visit  no  Do you have questions or concerns about your Care? no  Actions: * If pain score is 4 or above: No action needed, pain <4.

## 2012-07-15 ENCOUNTER — Encounter: Payer: Self-pay | Admitting: Gastroenterology

## 2012-07-16 ENCOUNTER — Telehealth: Payer: Self-pay | Admitting: Gastroenterology

## 2012-07-16 NOTE — Telephone Encounter (Signed)
Pt aware path report not reviewed I will call as soon as available

## 2012-07-21 ENCOUNTER — Telehealth: Payer: Self-pay | Admitting: Gastroenterology

## 2012-07-21 NOTE — Telephone Encounter (Signed)
I do not recommend any digestive enzymes for her issues.  Should continue as you wrote.  rov in 3-4 weeks.

## 2012-07-21 NOTE — Telephone Encounter (Signed)
Dr Christella Hartigan the pt called earlier with complaints of bloating and gas, she is taking Nexium and gas ex but has not been taking pepcid or zantac as recommended.  I advised her to begin the H2 blocker at night and call back in a week.  Pt agreed and is now calling back because she says that a digestive enzyme was suppose to be called in.  I do not see this mentioned in her chart.  Please advise

## 2012-07-21 NOTE — Telephone Encounter (Signed)
Left message on machine to call back  

## 2012-07-21 NOTE — Telephone Encounter (Signed)
Pt has not been taking Zantac or Pepcid at night she will try that for a week and call back to let Dr Christella Hartigan know if that is working

## 2012-07-21 NOTE — Telephone Encounter (Signed)
Pt has been notified and has appt scheduled for 08/25/12

## 2012-08-08 ENCOUNTER — Encounter (HOSPITAL_BASED_OUTPATIENT_CLINIC_OR_DEPARTMENT_OTHER): Payer: Self-pay | Admitting: *Deleted

## 2012-08-08 NOTE — Progress Notes (Signed)
NPO AFTER MN. ARRIVES AT 0730. NEEDS HG . WILL TAKE GABAPENTIN AM OF SURG W/ SIP OF WATER. 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

## 2012-08-18 ENCOUNTER — Encounter (HOSPITAL_BASED_OUTPATIENT_CLINIC_OR_DEPARTMENT_OTHER): Payer: Self-pay | Admitting: *Deleted

## 2012-08-18 ENCOUNTER — Encounter (HOSPITAL_BASED_OUTPATIENT_CLINIC_OR_DEPARTMENT_OTHER)
Admission: RE | Disposition: A | Discharge status: Home / Self Care | Payer: Self-pay | Source: Ambulatory Visit | Attending: Urology

## 2012-08-18 ENCOUNTER — Encounter (HOSPITAL_BASED_OUTPATIENT_CLINIC_OR_DEPARTMENT_OTHER): Payer: Self-pay | Admitting: Anesthesiology

## 2012-08-18 ENCOUNTER — Ambulatory Visit (HOSPITAL_BASED_OUTPATIENT_CLINIC_OR_DEPARTMENT_OTHER): Payer: Medicare HMO | Admitting: Anesthesiology

## 2012-08-18 ENCOUNTER — Ambulatory Visit (HOSPITAL_BASED_OUTPATIENT_CLINIC_OR_DEPARTMENT_OTHER)
Admission: RE | Admit: 2012-08-18 | Discharge: 2012-08-19 | Disposition: A | Discharge status: Home / Self Care | Payer: Medicare HMO | Source: Ambulatory Visit | Attending: Urology | Admitting: Urology

## 2012-08-18 DIAGNOSIS — Z882 Allergy status to sulfonamides status: Secondary | ICD-10-CM | POA: Insufficient documentation

## 2012-08-18 DIAGNOSIS — Z888 Allergy status to other drugs, medicaments and biological substances status: Secondary | ICD-10-CM | POA: Insufficient documentation

## 2012-08-18 DIAGNOSIS — Z79899 Other long term (current) drug therapy: Secondary | ICD-10-CM | POA: Insufficient documentation

## 2012-08-18 DIAGNOSIS — N3010 Interstitial cystitis (chronic) without hematuria: Secondary | ICD-10-CM | POA: Insufficient documentation

## 2012-08-18 DIAGNOSIS — Z87891 Personal history of nicotine dependence: Secondary | ICD-10-CM | POA: Insufficient documentation

## 2012-08-18 DIAGNOSIS — N898 Other specified noninflammatory disorders of vagina: Secondary | ICD-10-CM | POA: Insufficient documentation

## 2012-08-18 DIAGNOSIS — K219 Gastro-esophageal reflux disease without esophagitis: Secondary | ICD-10-CM | POA: Insufficient documentation

## 2012-08-18 DIAGNOSIS — N3941 Urge incontinence: Secondary | ICD-10-CM | POA: Insufficient documentation

## 2012-08-18 DIAGNOSIS — N9489 Other specified conditions associated with female genital organs and menstrual cycle: Secondary | ICD-10-CM | POA: Insufficient documentation

## 2012-08-18 DIAGNOSIS — N816 Rectocele: Secondary | ICD-10-CM

## 2012-08-18 HISTORY — PX: RECTOCELE REPAIR: SHX761

## 2012-08-18 LAB — CBC
MCV: 88 fL (ref 78.0–100.0)
Platelets: 218 10*3/uL (ref 150–400)
RBC: 4.07 MIL/uL (ref 3.87–5.11)
WBC: 6.4 10*3/uL (ref 4.0–10.5)

## 2012-08-18 LAB — CREATININE, SERUM: GFR calc Af Amer: >90 mL/min (ref >90)

## 2012-08-18 LAB — POCT HEMOGLOBIN-HEMACUE: Hemoglobin: 14.3 g/dL (ref 12.0–15.0)

## 2012-08-18 SURGERY — COLPORRHAPHY, POSTERIOR, FOR RECTOCELE REPAIR
Anesthesia: General | Site: Vagina | Wound class: Clean Contaminated

## 2012-08-18 MED ORDER — URELLE 81 MG PO TABS
1.0000 | ORAL_TABLET | Freq: Three times a day (TID) | ORAL | Status: DC
  Administered 2012-08-18 – 2012-08-19 (×3): 81 mg via ORAL
  Filled 2012-08-18: qty 1

## 2012-08-18 MED ORDER — BUPIVACAINE-EPINEPHRINE 0.5% -1:200000 IJ SOLN
INTRAMUSCULAR | Status: DC | PRN
  Administered 2012-08-18: 20 mL

## 2012-08-18 MED ORDER — PANTOPRAZOLE SODIUM 40 MG PO TBEC
40.0000 mg | DELAYED_RELEASE_TABLET | Freq: Every day | ORAL | Status: DC
  Filled 2012-08-18: qty 1

## 2012-08-18 MED ORDER — FENTANYL CITRATE 0.05 MG/ML IJ SOLN
25.0000 ug | INTRAMUSCULAR | Status: DC | PRN
  Filled 2012-08-18: qty 1

## 2012-08-18 MED ORDER — LACTATED RINGERS IV SOLN
INTRAVENOUS | Status: DC
  Administered 2012-08-18 (×2): via INTRAVENOUS
  Filled 2012-08-18: qty 1000

## 2012-08-18 MED ORDER — KETOROLAC TROMETHAMINE 30 MG/ML IJ SOLN
INTRAMUSCULAR | Status: DC | PRN
  Administered 2012-08-18: 30 mg via INTRAVENOUS

## 2012-08-18 MED ORDER — HYDROMORPHONE HCL PF 1 MG/ML IJ SOLN
0.5000 mg | INTRAMUSCULAR | Status: DC | PRN
Start: 2012-08-18 — End: 2012-08-19
  Filled 2012-08-18: qty 1

## 2012-08-18 MED ORDER — NEOSTIGMINE METHYLSULFATE 1 MG/ML IJ SOLN
INTRAMUSCULAR | Status: DC | PRN
Start: 2012-08-18 — End: 2012-08-18
  Administered 2012-08-18: 1.5 mg via INTRAVENOUS

## 2012-08-18 MED ORDER — SODIUM CHLORIDE 0.9 % IR SOLN
Status: DC | PRN
  Administered 2012-08-18: 10:00:00

## 2012-08-18 MED ORDER — POLYETHYLENE GLYCOL 3350 17 GM/SCOOP PO POWD
1.0000 | Freq: Every morning | ORAL | Status: DC
  Administered 2012-08-18 – 2012-08-19 (×2): 1 via ORAL
  Filled 2012-08-18: qty 255

## 2012-08-18 MED ORDER — GABAPENTIN 300 MG PO CAPS
300.0000 mg | ORAL_CAPSULE | Freq: Three times a day (TID) | ORAL | Status: DC
  Administered 2012-08-18 (×2): 300 mg via ORAL
  Filled 2012-08-18: qty 1

## 2012-08-18 MED ORDER — PROMETHAZINE HCL 25 MG/ML IJ SOLN
6.2500 mg | Freq: Once | INTRAMUSCULAR | Status: AC
  Administered 2012-08-18: 6.25 mg via INTRAVENOUS
  Filled 2012-08-18: qty 1

## 2012-08-18 MED ORDER — ONDANSETRON HCL 4 MG/2ML IJ SOLN
INTRAMUSCULAR | Status: DC | PRN
  Administered 2012-08-18: 4 mg via INTRAVENOUS

## 2012-08-18 MED ORDER — BISACODYL 10 MG RE SUPP
10.0000 mg | Freq: Every day | RECTAL | Status: DC | PRN
  Filled 2012-08-18: qty 1

## 2012-08-18 MED ORDER — ENOXAPARIN SODIUM 40 MG/0.4ML ~~LOC~~ SOLN
40.0000 mg | SUBCUTANEOUS | Status: DC
  Administered 2012-08-18: 40 mg via SUBCUTANEOUS
  Filled 2012-08-18: qty 0.4

## 2012-08-18 MED ORDER — ACETAMINOPHEN 10 MG/ML IV SOLN
1000.0000 mg | Freq: Four times a day (QID) | INTRAVENOUS | Status: DC
  Administered 2012-08-18: 1000 mg via INTRAVENOUS
  Filled 2012-08-18: qty 100

## 2012-08-18 MED ORDER — ENOXAPARIN SODIUM 40 MG/0.4ML ~~LOC~~ SOLN
40.0000 mg | SUBCUTANEOUS | Status: DC
  Filled 2012-08-18: qty 0.4

## 2012-08-18 MED ORDER — DEXAMETHASONE SODIUM PHOSPHATE 4 MG/ML IJ SOLN
INTRAMUSCULAR | Status: DC | PRN
  Administered 2012-08-18: 10 mg via INTRAVENOUS

## 2012-08-18 MED ORDER — LIDOCAINE HCL (CARDIAC) 20 MG/ML IV SOLN
INTRAVENOUS | Status: DC | PRN
  Administered 2012-08-18: 75 mg via INTRAVENOUS

## 2012-08-18 MED ORDER — CIPROFLOXACIN HCL 500 MG PO TABS
500.0000 mg | ORAL_TABLET | Freq: Two times a day (BID) | ORAL | Status: AC
  Administered 2012-08-19: 500 mg via ORAL
  Filled 2012-08-18: qty 1

## 2012-08-18 MED ORDER — FENTANYL CITRATE 0.05 MG/ML IJ SOLN
INTRAMUSCULAR | Status: DC | PRN
  Administered 2012-08-18 (×2): 100 ug via INTRAVENOUS

## 2012-08-18 MED ORDER — MIDAZOLAM HCL 5 MG/5ML IJ SOLN
INTRAMUSCULAR | Status: DC | PRN
  Administered 2012-08-18: 2 mg via INTRAVENOUS

## 2012-08-18 MED ORDER — PROPOFOL 10 MG/ML IV BOLUS
INTRAVENOUS | Status: DC | PRN
  Administered 2012-08-18: 120 mg via INTRAVENOUS

## 2012-08-18 MED ORDER — PENTOSAN POLYSULFATE SODIUM 100 MG PO CAPS
100.0000 mg | ORAL_CAPSULE | Freq: Two times a day (BID) | ORAL | Status: DC
  Administered 2012-08-18: 100 mg via ORAL
  Filled 2012-08-18: qty 1

## 2012-08-18 MED ORDER — HYDROCODONE-ACETAMINOPHEN 5-325 MG PO TABS
1.0000 | ORAL_TABLET | ORAL | Status: DC | PRN
  Filled 2012-08-18: qty 2

## 2012-08-18 MED ORDER — GLYCOPYRROLATE 0.2 MG/ML IJ SOLN
INTRAMUSCULAR | Status: DC | PRN
  Administered 2012-08-18: 0.3 mg via INTRAVENOUS

## 2012-08-18 MED ORDER — ROCURONIUM BROMIDE 100 MG/10ML IV SOLN
INTRAVENOUS | Status: DC | PRN
  Administered 2012-08-18: 40 mg via INTRAVENOUS

## 2012-08-18 MED ORDER — CEFAZOLIN SODIUM-DEXTROSE 2-3 GM-% IV SOLR
2.0000 g | INTRAVENOUS | Status: AC
  Administered 2012-08-18: 2 g via INTRAVENOUS
  Filled 2012-08-18: qty 50

## 2012-08-18 MED ORDER — STERILE WATER FOR IRRIGATION IR SOLN
Status: DC | PRN
  Administered 2012-08-18: 3000 mL

## 2012-08-18 MED ORDER — DEXTROSE-NACL 5-0.45 % IV SOLN
INTRAVENOUS | Status: DC
  Administered 2012-08-19: via INTRAVENOUS
  Filled 2012-08-18: qty 1000

## 2012-08-18 MED ORDER — EPHEDRINE SULFATE 50 MG/ML IJ SOLN
INTRAMUSCULAR | Status: DC | PRN
  Administered 2012-08-18: 5 mg via INTRAVENOUS

## 2012-08-18 MED ORDER — BELLADONNA ALKALOIDS-OPIUM 16.2-60 MG RE SUPP
RECTAL | Status: DC | PRN
  Administered 2012-08-18: 1 via RECTAL

## 2012-08-18 MED ORDER — OXYBUTYNIN CHLORIDE 5 MG PO TABS
5.0000 mg | ORAL_TABLET | Freq: Three times a day (TID) | ORAL | Status: DC | PRN
  Filled 2012-08-18: qty 1

## 2012-08-18 SURGICAL SUPPLY — 51 items
BAG DRAIN URO-CYSTO SKYTR STRL (DRAIN) IMPLANT
BAG URINE DRAINAGE (UROLOGICAL SUPPLIES) ×2 IMPLANT
BLADE HEX COATED 2.75 (ELECTRODE) ×2 IMPLANT
BLADE SURG 10 STRL SS (BLADE) ×2 IMPLANT
BLADE SURG 15 STRL LF DISP TIS (BLADE) ×1 IMPLANT
BLADE SURG 15 STRL SS (BLADE) ×1
BLADE SURG ROTATE 9660 (MISCELLANEOUS) ×2 IMPLANT
BOOTIES KNEE HIGH SLOAN (MISCELLANEOUS) ×2 IMPLANT
CANISTER SUCTION 1200CC (MISCELLANEOUS) IMPLANT
CANISTER SUCTION 2500CC (MISCELLANEOUS) IMPLANT
CATH FOLEY 2WAY SLVR  5CC 16FR (CATHETERS) ×1
CATH FOLEY 2WAY SLVR 5CC 16FR (CATHETERS) ×1 IMPLANT
CLOTH BEACON ORANGE TIMEOUT ST (SAFETY) ×2 IMPLANT
COVER MAYO STAND STRL (DRAPES) ×2 IMPLANT
DERMABOND ADVANCED (GAUZE/BANDAGES/DRESSINGS)
DERMABOND ADVANCED .7 DNX12 (GAUZE/BANDAGES/DRESSINGS) IMPLANT
DISSECTOR BLUNT TIP ENDO 5MM (MISCELLANEOUS) ×2 IMPLANT
DISSECTOR ROUND CHERRY 3/8 STR (MISCELLANEOUS) ×2 IMPLANT
DRAIN PENROSE 18X1/2 LTX STRL (DRAIN) IMPLANT
DRAPE CAMERA CLOSED 9X96 (DRAPES) ×2 IMPLANT
DRAPE LG THREE QUARTER DISP (DRAPES) ×2 IMPLANT
FLOSEAL 10ML (HEMOSTASIS) IMPLANT
GLOVE BIO SURGEON STRL SZ 6.5 (GLOVE) ×6 IMPLANT
GLOVE BIO SURGEON STRL SZ7 (GLOVE) ×4 IMPLANT
GLOVE BIO SURGEON STRL SZ7.5 (GLOVE) ×2 IMPLANT
GOWN STRL NON-REIN LRG LVL3 (GOWN DISPOSABLE) ×6 IMPLANT
GOWN STRL REIN XL XLG (GOWN DISPOSABLE) ×2 IMPLANT
NEEDLE HYPO 22GX1.5 SAFETY (NEEDLE) ×2 IMPLANT
PACK CYSTOSCOPY (CUSTOM PROCEDURE TRAY) ×2 IMPLANT
PACKING VAGINAL (PACKING) IMPLANT
PENCIL BUTTON HOLSTER BLD 10FT (ELECTRODE) ×2 IMPLANT
PLUG CATH AND CAP STER (CATHETERS) ×2 IMPLANT
RETRACTOR LONRSTAR 16.6X16.6CM (MISCELLANEOUS) ×1 IMPLANT
RETRACTOR STAY HOOK 5MM (MISCELLANEOUS) ×2 IMPLANT
RETRACTOR STER APS 16.6X16.6CM (MISCELLANEOUS) ×2
SLING SOLYX SYSTEM SIS BX (SLING) IMPLANT
SPONGE LAP 18X18 X RAY DECT (DISPOSABLE) IMPLANT
SPONGE LAP 4X18 X RAY DECT (DISPOSABLE) ×4 IMPLANT
SUCTION FRAZIER TIP 10 FR DISP (SUCTIONS) ×2 IMPLANT
SUT ETHILON 2 0 PS N (SUTURE) IMPLANT
SUT MNCRL AB 4-0 PS2 18 (SUTURE) ×6 IMPLANT
SUT MON AB 2-0 SH 27 (SUTURE)
SUT MON AB 2-0 SH27 (SUTURE) IMPLANT
SUT SILK 3 0 PS 1 (SUTURE) IMPLANT
SUT VIC AB 2-0 UR6 27 (SUTURE) ×6 IMPLANT
SYR BULB IRRIGATION 50ML (SYRINGE) ×2 IMPLANT
SYR CONTROL 10ML LL (SYRINGE) ×2 IMPLANT
SYRINGE 10CC LL (SYRINGE) ×2 IMPLANT
TUBE CONNECTING 12X1/4 (SUCTIONS) ×2 IMPLANT
WATER STERILE IRR 500ML POUR (IV SOLUTION) ×2 IMPLANT
YANKAUER SUCT BULB TIP NO VENT (SUCTIONS) IMPLANT

## 2012-08-18 NOTE — Anesthesia Postprocedure Evaluation (Signed)
  Anesthesia Post-op Note  Patient: [name]  Procedure(s) Performed: Procedure(s) (LRB): REPAIR POSTERIOR VAGINAL WALL SCAR AND VAGINOPLASTY  (N/A)  Patient Location: PACU  Anesthesia Type: General  Level of Consciousness: awake and alert   Airway and Oxygen Therapy: Patient Spontanous Breathing  Post-op Pain: mild  Post-op Assessment: Post-op Vital signs reviewed, Patient's Cardiovascular Status Stable, Respiratory Function Stable, Patent Airway and No signs of Nausea or vomiting  Last Vitals:  Filed Vitals:   08/18/12 1130  BP: 142/70  Pulse: 67  Temp:   Resp: 18    Post-op Vital Signs: stable   Complications: No apparent anesthesia complications

## 2012-08-18 NOTE — Anesthesia Preprocedure Evaluation (Addendum)
Anesthesia Evaluation  Patient identified by MRN, date of birth, ID band Patient awake    Reviewed: Allergy & Precautions, H&P , NPO status , Patient's Chart, lab work & pertinent test results, reviewed documented beta blocker date and time   History of Anesthesia Complications (+) PONV  Airway Mallampati: II TM Distance: >3 FB Neck ROM: full    Dental no notable dental hx.    Pulmonary former smoker,  breath sounds clear to auscultation  Pulmonary exam normal       Cardiovascular Exercise Tolerance: Good negative cardio ROS  Rhythm:regular Rate:Normal  ECG 2012: SB 48   Neuro/Psych Anxiety Chronic facial nerve palsy with right sided facial pain. Takes gabapentin.  Head tremors.    GI/Hepatic Neg liver ROS, hiatal hernia, GERD-  Medicated,  Endo/Other  negative endocrine ROS  Renal/GU negative Renal ROS  negative genitourinary   Musculoskeletal   Abdominal   Peds  Hematology negative hematology ROS (+)   Anesthesia Other Findings   Reproductive/Obstetrics negative OB ROS                          Anesthesia Physical Anesthesia Plan  ASA: II  Anesthesia Plan: General   Post-op Pain Management:    Induction: Intravenous  Airway Management Planned: Oral ETT  Additional Equipment:   Intra-op Plan:   Post-operative Plan: Extubation in OR  Informed Consent: I have reviewed the patients History and Physical, chart, labs and discussed the procedure including the risks, benefits and alternatives for the proposed anesthesia with the patient or authorized representative who has indicated his/her understanding and acceptance.   Dental Advisory Given  Plan Discussed with: CRNA  Anesthesia Plan Comments: (Significant GERD. Plan endotracheal intubation. Seeing Dr. Christella Hartigan for gastritis and gerd. Has stopped nexium.)       Anesthesia Quick Evaluation

## 2012-08-18 NOTE — Progress Notes (Signed)
Dr. Patsi Sears in to see pt.  Updated on pt's progress this pm walked x1, voided x3 w pvr via scan.  Last void w pvr via I&O cath resulted in 400. No new orders except start lovenox tonight instead of am.

## 2012-08-18 NOTE — H&P (Signed)
Active Problems Problems  1. Chronic Interstitial Cystitis 595.1 2. Female Stress Incontinence 625.6 3. Urge Incontinence Of Urine 788.31 4. Urinary Frequency 788.41 5. Vaginal Sling Operation For Stress Incontinence 6. Vaginal Wall Prolapse With Midline Cystocele 618.01 7. Vaginal Wall Prolapse With Rectocele 618.04  History of Present Illness        62 YO female returns today for a 7 week f/u after taking Myrbetriq 25mg .  She is s/p posterior vaginal wall prolapse repair with Xenoform biologic tissue and iliococcygeus repair 03/03/12. She is complaining of urge and urge incontinence, and has failed Vesicare (exacerbation of GERD); and Enablex (confusion).  She also has a hx of IC.   S/P Uphold on 04/05/11.  Posterior Pinnacle was not done because after the Uphold was done, the rectocele was very minimal.   Past Medical History Problems  1. History of  Arthritis V13.4 2. History of  Central Pain Syndrome 338.0 3. History of  Esophageal Reflux 530.81 4. History of  Fibromyalgia 729.1 5. History of  Nephrolithiasis V13.01 6. History of  Osteoporosis 733.00  Surgical History Problems  1. History of  Anterior Colporrhaphy, Repair Of Cystocele 2. History of  Bladder Surgery 3. History of  Cystoscopy With Manipulation Of Ureteral Calculus 4. History of  Gynecologic Surgery 5. History of  Lithotripsy 6. History of  Nasal Septal Deviation Repair 7. History of  Sacrospinous Ligament Fixation For Posthysterectomy Prolapse 8. History of  Sacrospinous Ligament Fixation For Posthysterectomy Prolapse 9. History of  Sacrospinous Ligament Fixation For Posthysterectomy Prolapse 10. History of  Shoulder Surgery 11. History of  Simple Bunion Exostectomy (Silver Procedure) 12. History of  Total Abdominal Hysterectomy With Removal Of Left Ovary 13. Vaginal Sling Operation For Stress Incontinence 14. History of  Vaginal Surg Insertion Of Mesh For Pelvic Floor Repair  Current Meds 1. Calcitonin  (Salmon) 200 UNIT/ACT Nasal Solution; Therapy: (Recorded:27Aug2012) to 2. Calcium + D TABS; Therapy: (Recorded:27Aug2012) to 3. Fish Oil CAPS; Therapy: (Recorded:27Aug2012) to 4. Flonase 50 MCG/ACT Nasal Suspension; Therapy: (Recorded:27Aug2012) to 5. Multiple Vitamin TABS; Therapy: (Recorded:27Aug2012) to 6. Neurontin 300 MG Oral Capsule; Therapy: (Recorded:27Aug2012) to 7. NexIUM 40 MG Oral Capsule Delayed Release; Therapy: (Recorded:27Aug2012) to 8. Premarin 0.625 MG/GM Vaginal Cream; Apply 1 inch nightly to vaginal opening area; Therapy:  27Aug2012 to (Evaluate:06Jun2014); Last Rx:11Jun2013 9. Vitamin C 500 MG Oral Tablet; Therapy: (Recorded:27Aug2012) to 10. Vitamin D3 5000 UNIT Oral Tablet; Therapy: (Recorded:27Aug2012) to  Allergies Medication  1. Bactrim TABS 2. Elmiron CAPS 3. Enablex TB24  Family History Problems  1. Family history of  Family Health Status Number Of Children 1 son, 2 daughters 2. Family history of  Father Deceased At Age 34 CHF 3. Sororal history of  Heart Disease V17.49 4. Family history of  Mother Deceased At Age 24 Non-Hodgkin's Lymphoma 5. Maternal history of  Non-Hodgkin's Lymphoma 6. Maternal history of  Transient Ischemic Attack  Social History Problems  1. Former Smoker V15.82 smoked x 19yrs, quit 50yrs ago 2. Marital History - Currently Married 3. 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 History of  Alcohol Use 5. History of  Caffeine Use  Vitals Vital Signs [Data Includes: Last 1 Day]  05Mar2014 04:06PM  Blood Pressure: 114 / 72 Temperature: 98 F Heart Rate: 67  Physical Exam Constitutional: Well nourished and well developed . No acute distress.  Abdomen: The abdomen is soft and nontender. No masses are palpated. No CVA tenderness. No hernias are palpable. No hepatosplenomegaly noted.  Genitourinary:. Sutures in place. resolving posterior vault  swellling. posterior vault lump palpable.  Chaperone Present: kim lewis.  Examination of the  external genitalia shows normal female external genitalia. The urethra is not tender. Urethral hypermobility is not present. There is no urethral discharge. Vaginal exam demonstrates the vaginal epithelium to be poorly estrogenized, but no atrophy, no discharge and no tenderness. No cystocele is identified. No enterocele is identified. No rectocele is identified. The cervix is is absent. The adnexa are palpably normal. The bladder is normal on palpation.  Skin: Normal skin turgor, no visible rash and no visible skin lesions.  Neuro/Psych:. Mood and affect are appropriate.    Assessment Assessed  1. Chronic Interstitial Cystitis 595.1 2. Vaginal Pain 625.8 3. Vaginal Wall Prolapse With Rectocele 618.04   Interstitial cystitis, improved on Elmiron. She has a posteior vaginal wall lump which will be removed. She wil continue her neurontin.   Plan  Continue Elmiron and neurontin.   Signatures Electronically signed by : Jethro Bolus, M.D.; Jul 02 2012  4:28PM

## 2012-08-18 NOTE — Transfer of Care (Signed)
Immediate Anesthesia Transfer of Care Note  Patient: [name]  Procedure(s) Performed: Procedure(s): REPAIR POSTERIOR VAGINAL WALL SCAR AND VAGINOPLASTY  (N/A)  Patient Location: PACU  Anesthesia Type:General  Level of Consciousness: awake and oriented  Airway & Oxygen Therapy: Patient Spontanous Breathing and Patient connected to nasal cannula oxygen  Post-op Assessment: Report given to PACU RN  Post vital signs: Reviewed and stable  Complications: No apparent anesthesia complications

## 2012-08-18 NOTE — Interval H&P Note (Signed)
History and Physical Interval Note:  08/18/2012 8:43 AM  [name]  has presented today for surgery, with the diagnosis of rectocele / mass   The various methods of treatment have been discussed with the patient and family. After consideration of risks, benefits and other options for treatment, the patient has consented to  Procedure(s): REPAIR POSTERIOR VAGINAL WALL SCAR AND VAGINOPLASTY  (N/A) as a surgical intervention .  The patient's history has been reviewed, patient examined, no change in status, stable for surgery.  I have reviewed the patient's chart and labs.  Questions were answered to the patient's satisfaction.     Jethro Bolus I

## 2012-08-18 NOTE — Progress Notes (Signed)
Assessment completed. Patient alert watching T.V No acute distress noted this far. Will continue to monitor.

## 2012-08-18 NOTE — Progress Notes (Signed)
Ambulated w minimal assistance entire length of unit.  Pt tolerated well.

## 2012-08-18 NOTE — Anesthesia Procedure Notes (Signed)
Procedure Name: Intubation Date/Time: 08/18/2012 8:58 AM Performed by: Maris Berger T Pre-anesthesia Checklist: Patient identified, Emergency Drugs available, Suction available and Patient being monitored Patient Re-evaluated:Patient Re-evaluated prior to inductionOxygen Delivery Method: Circle System Utilized Preoxygenation: Pre-oxygenation with 100% oxygen Intubation Type: IV induction Ventilation: Mask ventilation without difficulty Laryngoscope Size: Mac and 3 Grade View: Grade II Tube type: Oral Tube size: 7.0 mm Number of attempts: 1 Airway Equipment and Method: stylet Placement Confirmation: ETT inserted through vocal cords under direct vision,  positive ETCO2 and breath sounds checked- equal and bilateral Secured at: 21 cm Tube secured with: Tape Dental Injury: Teeth and Oropharynx as per pre-operative assessment

## 2012-08-18 NOTE — Op Note (Signed)
Pre-operative diagnosis : Posterior vaginal mass  Postoperative diagnosis:  Same  Operation:   Excision of posterior vaginal wall mass  Surgeon:  S. Patsi Sears, MD  First assistant:   Same  Anesthesia:  general  Preparation:  After appropriate preanesthesia, the patient was brought to the operating room, placed on the operating table in dorsal supine position where general LMA anesthesia was introduced. She was replaced in the dorsal lithotomy position with pubis was prepped with Betadine solution and draped in usual fashion. Armband was double checked. History was reviewed.  Review history:  Active Problems  Problems  1. Chronic Interstitial Cystitis 595.1  2. Female Stress Incontinence 625.6  3. Urge Incontinence Of Urine 788.31  4. Urinary Frequency 788.41  5. Vaginal Sling Operation For Stress Incontinence  6. Vaginal Wall Prolapse With Midline Cystocele 618.01  7. Vaginal Wall Prolapse With Rectocele 618.04  History of Present Illness  62 YO female returns today for a 7 week f/u after taking Myrbetriq 25mg . She is s/p posterior vaginal wall prolapse repair with Xenoform biologic tissue and iliococcygeus repair 03/03/12. She is complaining of urge and urge incontinence, and has failed Vesicare (exacerbation of GERD); and Enablex (confusion). She also has a hx of IC.  S/P Uphold on 04/05/11. Posterior Pinnacle was not done because after the Uphold was done, the rectocele was very minimal.    Statement of  Likelihood of Success: Excellent. TIME-OUT observed.:  Procedure:  Examination showed mass in the posterior vagina, consistent with scar from biologic tissue posterior vaginal wall repair.                          Outline of excision was accomplished. Injection of Marcaine 0.5 with epinephrine 1 200,000 was accomplished, and then triangular posterior vaginal wall incision was accomplished and the perineum, and dissection was accomplished along the side walls of the mass. The  epithelium could not be dissected away from the mass, and the entire mass was excised. Dissection under the posterior vaginal wall was accomplished, and using 2-0 Vicryl suture, sidewalls were brought together. Rectal examination revealed no rectal damage.  4-0 Vicryl sutures were then used to close the posterior vaginal wall, as well as interspersed 2-0 Vicryl horizontal mattress sutures. Minimal bleeding was noted. No Foley catheter was left. No packing was placed. The patient received IV Tylenol at the beginning of the procedure, IV Toradol at the end of procedure. She also received IV antibiotic. She was awakened, taken to recovery room with SCDs in place. She was in good condition.

## 2012-08-18 NOTE — Progress Notes (Signed)
Dr. Margarita Grizzle informed of post void scans of 170, 200, 200 withgood void amounts.  pvr via cath was 400cc's clear yellow urine..   pvr via cath to be done in am .

## 2012-08-18 NOTE — Progress Notes (Signed)
Pt up and walked w minimal assistance.  Walked to [employer] , and back.  No c/o pain. Pt tolerated walk well.

## 2012-08-19 ENCOUNTER — Encounter (HOSPITAL_BASED_OUTPATIENT_CLINIC_OR_DEPARTMENT_OTHER): Payer: Self-pay | Admitting: Urology

## 2012-08-19 MED ORDER — TRAMADOL-ACETAMINOPHEN 37.5-325 MG PO TABS: 1.0000 | ORAL_TABLET | Freq: Four times a day (QID) | ORAL | Status: DC | PRN

## 2012-08-19 MED ORDER — CEPHALEXIN 500 MG PO CAPS: 500.0000 mg | ORAL_CAPSULE | Freq: Two times a day (BID) | ORAL | Status: DC

## 2012-08-25 ENCOUNTER — Ambulatory Visit: Payer: Medicare HMO | Admitting: Gastroenterology

## 2012-09-05 ENCOUNTER — Encounter: Payer: Self-pay | Admitting: Gastroenterology

## 2012-09-05 ENCOUNTER — Telehealth: Payer: Self-pay | Admitting: Gastroenterology

## 2012-09-05 ENCOUNTER — Ambulatory Visit (INDEPENDENT_AMBULATORY_CARE_PROVIDER_SITE_OTHER): Payer: Medicare HMO | Admitting: Gastroenterology

## 2012-09-05 VITALS — BP 116/60 | HR 64 | Ht 61.5 in | Wt 135.0 lb

## 2012-09-05 DIAGNOSIS — R141 Gas pain: Secondary | ICD-10-CM

## 2012-09-05 DIAGNOSIS — R140 Abdominal distension (gaseous): Secondary | ICD-10-CM

## 2012-09-05 MED ORDER — SUCRALFATE 1 GM/10ML PO SUSP: 1.0000 g | Freq: Two times a day (BID) | ORAL | Status: AC

## 2012-09-05 NOTE — Progress Notes (Signed)
Review of pertinent gastrointestinal problems:  1. Routine risk for colon cancer:Colonsocopy 03/2008 Dr. Karilyn Cota (for lower abd pain, IDA): normal colonoscopy  2. Chronic abd pain EGD 03/2008 Dr. Karilyn Cota (for abd pain, IDA): 2cm HH, otherwise normal; duodenum biopsies normal ; repeat EGD for dyspepsia 06/2012 There was mild, non-specific gastritis distally. Biopsies taken and sent to pathology. There was a small (1cm) hiatal hernia, biopsies showed no H. pylori 3. Gastroparesis based on slow emptying GES 03/2011, I recommended small, frequent meals, she cancelled follow up in 2012 and did not reschedule in 2014, February.  4. Intra abdominal adhesions, rectocele, s/p laparascopy 02/2012 FINDINGS AT THE TIME OF SURGERY: Moderate amount of adhesions and omentum to the anterior abdominal wall, small bowel to the right side of the cul-de-sac and large bowel adhesed to the left side of the cul-de- sac.    HPI: This is a  62 year old woman whom I last saw about 2 months ago  She stopped nexium and instead started MOM and gaviscon.  Still has bloating.  Very bothersome.    She eats 2 meals per day.  She tried gluten free diet last year, improved her bloating for a while.  Then bloating returned despite continuing off of gluten  Her chiropracter advised her on acid remidies, tried apple cider vinegar and taking a digestive enzyme.  Then tried licorice root. Bloating improved but then pyrosis returned.  Swallowing difficulty unless she swallows better.    Has chest pain that goes to her back.  This is a bit improved but not really.  Tried zantac at night.  Her neck bothers her as well.  No NSAIDs.  Surgery with Dr. Marcello Fennel, for vaginal surgery, prolapse, mass.  She has been having pelvic or vaginal pain today, going back in to see urology today.  She mentioned neause, chest pain, pyrosis, bowel troubles, belching, flatulents, bloating.  Myriad upper and lower GI complaints.  Past Medical History   Diagnosis Date  . GERD (gastroesophageal reflux disease)   . PONV (postoperative nausea and vomiting)   . Anxiety     hx panic attacks;  no meds  . History of kidney stones   . 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 Interstitial cystitis   . Frequency of urination   . Urgency of urination   . Nocturia   . Arthritis NECK AND SHOULDERS  . Chronic facial pain SECONDARY TO NERVE PALSY--  TAKES GABAPENTIN  . H/O hiatal hernia     Past Surgical History  Procedure Laterality Date  . Bunionectomy  SEPT 2005    LEFT FOOT  . Pubovaginal sling  04/05/2011    Procedure: Leonides Grills;  Surgeon: Kathi Ludwig, MD;  Location: [facility];  Service: Urology;  Laterality: N/A;  ANTERIOR UPHOLD LITE   AND CYSTOSCOPY  . Abdominal hysterectomy  1987    AND BURCH PROCEDURE  (BLADDER SUSPENSION)  . Tubal ligation  1980  . Nasal sinus surgery  DEC 2009  . Breast reduction surgery  2009    BILATERAL  . Attempted ureteroscopic stone extraction  2003  . Extracorporeal shock wave lithotripsy  2002    RIGHT SIDE  . Right shoulder surgery  DEC 2005  . Bladder sling procedure  2005  . Laparoscopy  03/03/2012    Procedure: LAPAROSCOPY OPERATIVE;  Surgeon: Turner Daniels, MD;  Location: [facility];  Service: Gynecology;  Laterality: N/A;  . Laparoscopic lysis of adhesions  03/03/2012  Procedure: LAPAROSCOPIC LYSIS OF ADHESIONS;  Surgeon: Turner Daniels, MD;  Location: [facility];  Service: Gynecology;  Laterality: N/A;  . Vaginal prolapse repair  03/03/2012    Procedure: VAGINAL VAULT SUSPENSION;  Surgeon: Kathi Ludwig, MD;  Location: [facility];  Service: Urology;  Laterality: N/A;  repair with xenform with ileococcygeus repair  . Rectocele repair N/A 08/18/2012    Procedure: REPAIR POSTERIOR VAGINAL WALL SCAR AND VAGINOPLASTY ;  Surgeon: Kathi Ludwig, MD;  Location: [facility];  Service:  Urology;  Laterality: N/A;    Current Outpatient Prescriptions  Medication Sig Dispense Refill  . acetaminophen (TYLENOL) 325 MG tablet Take 650 mg by mouth every 6 (six) hours as needed.      . Alum Hydroxide-Mag Carbonate (GAVISCON PO) Take by mouth as needed.      . calcitonin, salmon, (MIACALCIN/FORTICAL) 200 UNIT/ACT nasal spray Place 1 spray into the nose daily.       . Cholecalciferol (VITAMIN D-3) 5000 UNITS TABS Take 1 capsule by mouth daily.      Marland Kitchen conjugated estrogens (PREMARIN) vaginal cream Place vaginally 2 (two) times a week.       . fluticasone (FLONASE) 50 MCG/ACT nasal spray Place 2 sprays into the nose as needed.       . gabapentin (NEURONTIN) 300 MG capsule Take 300 mg by mouth 3 (three) times daily. Hs takes 600mg       . Magnesium Hydroxide (MILK OF MAGNESIA PO) Take by mouth as needed.      . Multiple Vitamins-Minerals (MULTIVITAMIN WITH MINERALS) tablet Take 1 tablet by mouth daily.       . Omega-3 Fatty Acids (FISH OIL) 1200 MG CAPS Take 4 capsules by mouth daily.      . pentosan polysulfate (ELMIRON) 100 MG capsule Take 100 mg by mouth 2 (two) times daily.      . Polyethylene Glycol 3350 (MIRALAX PO) Take by mouth daily.      Marland Kitchen URELLE (URELLE/URISED) 81 MG TABS Take 1 tablet by mouth 3 (three) times daily as needed.      . vitamin C (ASCORBIC ACID) 500 MG tablet Take 500 mg by mouth daily.        No current facility-administered medications for this visit.    Allergies as of 09/05/2012 - Review Complete 09/05/2012  Allergen Reaction Noted  . Milk-related compounds Other (See Comments) 04/02/2011  . Bactrim Hives 01/08/2011  . Sulfa antibiotics Hives 06/10/2012    Family History  Problem Relation Age of Onset  . Lymphoma Mother   . Heart disease Father   . Colon cancer Neg Hx   . Esophageal cancer Neg Hx   . Rectal cancer Neg Hx   . Stomach cancer Neg Hx     History   Social History  . Marital Status: Married    Spouse Name: N/A    Number of  Children: 3  . Years of Education: N/A   Occupational History  . Homemaker    Social History Main Topics  . Smoking status: Former Smoker -- 10 years    Quit date: 05/01/1975  . Smokeless tobacco: Never Used  . Alcohol Use: No  . Drug Use: No  . Sexually Active: Yes   Other Topics Concern  . Not on file   Social History Narrative  . No narrative on file      Physical Exam: BP 116/60  Pulse 64  Ht 5' 1.5" (1.562 m)  Wt 135 lb (61.236 kg)  BMI 25.1 kg/m2 Constitutional: generally well-appearing Psychiatric: alert and oriented x3 Abdomen: soft, nontender, nondistended, no obvious ascites, no peritoneal signs, normal bowel sounds     Assessment and plan: 62 y.o. female with myriad upper and lower GI functional complaints, mostly bloating  She also has GERD but does not want to go back on proton pump inhibitor. I advised she try H2 blocker. She has documented gastroparesis and yet still only eats 2 meals a day. I advised her to try 4-5 meals a day instead. She hurt Carafate and like to try and give her prescription for. She will call if she has any further questions or concerns

## 2012-09-05 NOTE — Patient Instructions (Addendum)
Trial of carafate, take one dose twice daily. Eat smaller, more frequent meals (4-5 small meals per day) rather than 2 meals per day. Ok to start eating gluten a day. Return to see Dr. Christella Hartigan as needed. Avoid all unnessesary chemicals such as herbs, vitamins, natural products. You can restart pepcid or zantac twice daily if needed.

## 2012-09-05 NOTE — Telephone Encounter (Signed)
Pt wants to know if she should be taking digestive enzymes. Please advise.

## 2012-09-08 NOTE — Telephone Encounter (Signed)
Pt has been notified that digestive enzymes are not needed.

## 2012-09-08 NOTE — Telephone Encounter (Signed)
No, i don't think she should be taking digestive enzymes

## 2012-10-24 ENCOUNTER — Ambulatory Visit: Payer: Self-pay | Admitting: Neurology

## 2014-02-08 ENCOUNTER — Telehealth: Payer: Self-pay | Admitting: *Deleted

## 2014-02-08 NOTE — Telephone Encounter (Signed)
Pt called has seen Dr Nelva Bush for her back was given a steroid injection in her back. Pt was also rx'd med Oxycodone 5/325 pt states the 1st injection didn't help & is taking 5 to 6 pain meds a day. She is scheduled for a 2nd injection but not till the end of Oct.. Pt is worried about her liver with the amount of meds shes taking on a daily basis. Just wants Dr Crissie Sickles thoughts

## 2014-02-08 NOTE — Telephone Encounter (Signed)
Called pt back with Dr Jacelyn Pi recommendations per Dr Crissie Sickles it should be fine & if pt is worried she can come in for blood work to check functions. Pt aware and at this time will just wait but if she changes her mind she will call us back.

## 2014-09-06 ENCOUNTER — Encounter: Payer: Self-pay | Admitting: Neurology

## 2014-09-06 ENCOUNTER — Ambulatory Visit (INDEPENDENT_AMBULATORY_CARE_PROVIDER_SITE_OTHER): Payer: Medicare Other | Admitting: Neurology

## 2014-09-06 VITALS — BP 124/72 | HR 68 | Resp 14 | Ht 61.5 in | Wt 132.0 lb

## 2014-09-06 DIAGNOSIS — G243 Spasmodic torticollis: Secondary | ICD-10-CM

## 2014-09-06 DIAGNOSIS — G501 Atypical facial pain: Secondary | ICD-10-CM

## 2014-09-06 DIAGNOSIS — G252 Other specified forms of tremor: Secondary | ICD-10-CM

## 2014-09-06 MED ORDER — CARBAMAZEPINE ER 100 MG PO TB12: ORAL_TABLET | ORAL | Status: DC

## 2014-09-06 NOTE — Progress Notes (Signed)
Subjective:    Patient ID: [name] is a 64 y.o. female.  HPI     Interim history:   [name] is a 64 year-old right-handed woman with an underlying medical history of fibromyalgia, and bladder problems, kidney stones, and previous diagnosis of facial pain and cervical dystonia, who presents for follow-up consultation. She is accompanied by her daughter, [name], today. This is her first visit together. She previously followed with Dr. Jeneen Rinks love and was last seen by him on 06/26/2012. I reviewed Dr. Tressia Danas note. Prior to that she saw Dr. Krista Blue who diagnosed her with trigeminal neuralgia and cervical dystonia. She has previously been tried on multiple different medications for facial pain. She has a history of right-sided mid facial pain. She is status post tooth extraction in 1999. She describes a dull, pressure-like and throbbing pain. She was tried on gabapentin, Lyrica, baclofen and Flexeril. She also reports spasms in her head and neck. She was seen by neurosurgery at [facility]. She had an MRI of the brain with and without contrast as well as a cervical spine MRI without contrast in October 2012. She had bulging disks at C5-6 and C6-7 and moderate right foraminal stenosis. Baclofen caused dizziness and grogginess. Flexeril caused palpitations. Gabapentin did not help very much. She did not want to try Botox for cervical dystonia.  Today, she reports that her right mid facial pain bothers her from time to time it is a constant dull and achy pain. It is not a lightning like shooting pain. She has soreness of her right upper gumline. She says that her dentist will not fit her with dentures because he worries that her pain will aggravate it. She has worsening had tremor and worsening had position. Her head shakes from side to side. She is still reluctant to consider Botox injections and primarily she feels that she may not have enough coverage with her insurance for this. She  currently takes gabapentin 300 mg in the morning and midday and 600 mg at night. He does make her sleepy. When she tried 600 mg 3 times a day she was too groggy from it. It is currently prescribed her primary care physician. She had blood work with her PCP in December. She reports that her kidney and liver function was fine at the time. Additional history is provided by her daughter. She adds that her mother is quite anxious and worries about her symptoms.  Her Past Medical History Is Significant For: Past Medical History  Diagnosis Date  . GERD (gastroesophageal reflux disease)   . PONV (postoperative nausea and vomiting)   . Anxiety     hx panic attacks;  no meds  . History of kidney stones   . 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 Interstitial cystitis   . Frequency of urination   . Urgency of urination   . Nocturia   . Arthritis NECK AND SHOULDERS  . Chronic facial pain SECONDARY TO NERVE PALSY--  TAKES GABAPENTIN  . H/O hiatal hernia     Her Past Surgical History Is Significant For: Past Surgical History  Procedure Laterality Date  . Bunionectomy  SEPT 2005    LEFT FOOT  . Pubovaginal sling  04/05/2011    Procedure: Gaynelle Arabian;  Surgeon: Ailene Rud, MD;  Location: [facility];  Service: Urology;  Laterality: N/A;  ANTERIOR UPHOLD LITE   AND CYSTOSCOPY  . Abdominal hysterectomy  [place]  PROCEDURE  (BLADDER SUSPENSION)  . Tubal ligation  1980  . Nasal sinus surgery  DEC 2009  . Breast reduction surgery  2009    BILATERAL  . Attempted ureteroscopic stone extraction  2003  . Extracorporeal shock wave lithotripsy  2002    RIGHT SIDE  . Right shoulder surgery  DEC 2005  . Bladder sling procedure  2005  . Laparoscopy  03/03/2012    Procedure: LAPAROSCOPY OPERATIVE;  Surgeon: Luz Lex, MD;  Location: [facility];  Service: Gynecology;  Laterality: N/A;  . Laparoscopic lysis of adhesions  03/03/2012     Procedure: LAPAROSCOPIC LYSIS OF ADHESIONS;  Surgeon: Luz Lex, MD;  Location: [facility];  Service: Gynecology;  Laterality: N/A;  . Vaginal prolapse repair  03/03/2012    Procedure: VAGINAL VAULT SUSPENSION;  Surgeon: Ailene Rud, MD;  Location: [facility];  Service: Urology;  Laterality: N/A;  repair with xenform with ileococcygeus repair  . Rectocele repair N/A 08/18/2012    Procedure: REPAIR POSTERIOR VAGINAL WALL SCAR AND VAGINOPLASTY ;  Surgeon: Ailene Rud, MD;  Location: [facility];  Service: Urology;  Laterality: N/A;    Her Family History Is Significant For: Family History  Problem Relation Age of Onset  . Lymphoma Mother   . Heart disease Father   . Heart failure Father   . Colon cancer Neg Hx   . Esophageal cancer Neg Hx   . Rectal cancer Neg Hx   . Stomach cancer Neg Hx     Her Social History Is Significant For: History   Social History  . Marital Status: Married    Spouse Name: N/A  . Number of Children: 2  . Years of Education: [employer]   Occupational History  . Homemaker    Social History Main Topics  . Smoking status: Former Smoker -- 10 years    Quit date: 05/01/1975  . Smokeless tobacco: Never Used  . Alcohol Use: No  . Drug Use: No  . Sexual Activity: Yes   Other Topics Concern  . None   Social History Narrative   No caffeine use     Her Allergies Are:  Allergies  Allergen Reactions  . Milk-Related Compounds Other (See Comments)    DAIRY PRODUCTS--- SCRATCHY THROAT/ HX SINUS INFECTION  . Bactrim Hives  . Sulfa Antibiotics Hives  :   Her Current Medications Are:  Outpatient Encounter Prescriptions as of 09/06/2014  Medication Sig  . calcitonin, salmon, (MIACALCIN/FORTICAL) 200 UNIT/ACT nasal spray Place 1 spray into the nose daily.   . Cholecalciferol (VITAMIN D-3) 5000 UNITS TABS Take 1 capsule by mouth daily.  . fluticasone (FLONASE) 50 MCG/ACT nasal spray Place 2  sprays into the nose as needed.   . gabapentin (NEURONTIN) 300 MG capsule Take 300 mg by mouth 3 (three) times daily. Hs takes 600mg   . Multiple Vitamins-Minerals (MULTIVITAMIN WITH MINERALS) tablet Take 1 tablet by mouth daily.   . Omega-3 Fatty Acids (FISH OIL) 1200 MG CAPS Take 4 capsules by mouth daily.  . pentosan polysulfate (ELMIRON) 100 MG capsule Take 100 mg by mouth 2 (two) times daily.  . carbamazepine (TEGRETOL XR) 100 MG 12 hr tablet Take 1 pill each bedtime for 2 weeks, then 1 pill 2 times a day.  . sucralfate (CARAFATE) 1 GM/10ML suspension Take 10 mLs (1 g total) by mouth 2 (two) times daily.  . [DISCONTINUED] acetaminophen (TYLENOL) 325 MG tablet Take 650 mg by  mouth every 6 (six) hours as needed.  . [DISCONTINUED] Alum Hydroxide-Mag Carbonate (GAVISCON PO) Take by mouth as needed.  . [DISCONTINUED] carbamazepine (TEGRETOL XR) 100 MG 12 hr tablet Take 1 pill each bedtime for 2 weeks, then 1 pill 2 times a day.  . [DISCONTINUED] conjugated estrogens (PREMARIN) vaginal cream Place vaginally 2 (two) times a week.   . [DISCONTINUED] Magnesium Hydroxide (MILK OF MAGNESIA PO) Take by mouth as needed.  . [DISCONTINUED] Polyethylene Glycol 3350 (MIRALAX PO) Take by mouth daily.  . [DISCONTINUED] URELLE (URELLE/URISED) 81 MG TABS Take 1 tablet by mouth 3 (three) times daily as needed.  . [DISCONTINUED] vitamin C (ASCORBIC ACID) 500 MG tablet Take 500 mg by mouth daily.    No facility-administered encounter medications on file as of 09/06/2014.  :  Review of Systems:  Out of a complete 14 point review of systems, all are reviewed and negative with the exception of these symptoms as listed below:   Review of Systems  Eyes:       Blurred vision   Gastrointestinal:       Incontinence   Musculoskeletal:       Joint pain   Neurological: Positive for dizziness, tremors and numbness.    Objective:  Neurologic Exam  Physical Exam Physical Examination:   Filed Vitals:   09/06/14  1544  BP: 124/72  Pulse: 68  Resp: 14    General Examination: The patient is a very pleasant 64 y.o. female in no acute distress. She appears well-developed and well-nourished and well groomed. She is anxious appearing.  HEENT: Normocephalic, atraumatic, pupils are equal, round and reactive to light and accommodation. Funduscopic exam is normal with sharp disc margins noted. Extraocular tracking is good without limitation to gaze excursion or nystagmus noted. Normal smooth pursuit is noted. Hearing is grossly intact. Tympanic membranes are clear bilaterally. Face is symmetric with normal facial animation and normal facial sensation, with the exception of mild decrease in temperature sense in the right mid face. Speech is clear with no dysarthria noted. There is no hypophonia. There is a dystonic side to side head tremor. She has evidence of cervical dystonia with evidence of left torticollis and mild right lateral Collis and elevation of left shoulder. She has hypertrophy in her left upper trapezius muscles and posterior neck muscles bilaterally as well as of right SCM muscle. There are no carotid bruits on auscultation. Oropharynx exam reveals: moderate mouth dryness, she is missing multiple teeth. She has adequate dental hygiene. Mallampati is class II. Tongue protrudes centrally and palate elevates symmetrically.   Chest: Clear to auscultation without wheezing, rhonchi or crackles noted.  Heart: S1+S2+0, regular and normal without murmurs, rubs or gallops noted.   Abdomen: Soft, non-tender and non-distended with normal bowel sounds appreciated on auscultation.  Extremities: There is no pitting edema in the distal lower extremities bilaterally. Pedal pulses are intact.  Skin: Warm and dry without trophic changes noted. There are no varicose veins.  Musculoskeletal: exam reveals no obvious joint deformities, tenderness or joint swelling or erythema.   Neurologically:  Mental status: The  patient is awake, alert and oriented in all 4 spheres. Her immediate and remote memory, attention, language skills and fund of knowledge are appropriate. There is no evidence of aphasia, agnosia, apraxia or anomia. Speech is clear with normal prosody and enunciation. Thought process is linear. Mood is normal and affect is blunted.  Cranial nerves II - XII are as described above under HEENT exam. In addition: shoulder  shrug is normal with equal shoulder height noted. Motor exam: Normal bulk, strength and tone is noted. There is no drift, tremor or rebound. Romberg is negative. Reflexes are 2+ throughout. Babinski: Toes are flexor bilaterally. Fine motor skills and coordination: intact with normal finger taps, normal hand movements, normal rapid alternating patting, normal foot taps and normal foot agility.  Cerebellar testing: No dysmetria or intention tremor on finger to nose testing. Heel to shin is unremarkable bilaterally. There is no truncal or gait ataxia.  Sensory exam: intact to light touch, pinprick, vibration, temperature sense in the upper and lower extremities.  Gait, station and balance: She stands easily. No veering to one side is noted. No leaning to one side is noted. Posture is age-appropriate and stance is narrow based. Gait shows normal stride length and normal pace. No problems turning are noted. She turns en bloc. Tandem walk is slightly difficult for her.                Assessment and Plan:   In summary, [name] is a very pleasant 64 y.o.-year old female with an underlying medical history of fibromyalgia, and bladder problems, kidney stones, and previous diagnosis of facial pain and cervical dystonia, who presents for follow-up consultation. On examination she has evidence of cervical dystonia with dystonic head tremor. The patient reports right facial pain which is not classic for trigeminal neuralgia and the description of the pain. She is currently symptomatically treated  with gabapentin, 1200 mg daily total dose. She has not tried Tegretol or Trileptal or any other antiepileptic medications that could potentially be helpful. I had a long discussion with the patient and her daughter. She had workup for her facial pain and has seen multiple specialists including oral surgery, ENT, and dentist. For her atypical facial pain we can try Tegretol-XR, 100 mg strength one pill once daily at bedtime for 2 weeks and then 1 pill twice daily thereafter. She is advised to reduce her gabapentin to 1 pill 3 times a day when she starts the Tegretol-XR. Potential side effects are discussed with her. I would like to go ahead and do CMP and CBC with differential today. We will call her with her test results. In addition, we can certainly repeat her brain MRI with and without contrast and also do a brain MRA. We will call her with her test results. We will compare with findings from 2012. Her physical exam is otherwise nonfocal. She has obvious evidence of cervical dystonia and a dystonic head tremor for which I have suggested botulinum toxin injections. I talked to her about potential side effects with botulinum toxin injections, the expectations and limitations and the frequency of injections which would be every 90 days approximately. We will look into insurance coverage for her for this procedure. I advised her that botulinum toxin injection is the go to treatment for cervical dystonia. She has tried other medications in the past which were not helpful and caused primarily sedation as a side effect. I will see her back routinely in 3 months, I will prescribe Tegretol-XR for her and gave her instructions, and we will call her in the interim with her test results. I answered all her questions today and the patient and her daughter were in agreement.   Most of my 45 minute visit today was spent in counseling and coordination of care, reviewing test results and reviewing medications and her Dx.

## 2014-09-06 NOTE — Patient Instructions (Signed)
We will try to get information on doing botulinum toxin injections for your cervical dystonia.   We will try tegretol XR 100 mg strength for your atypical facial pain. Take 1 pill each bedtime for 2 weeks, then 1 pill 2 times a day thereafter.  Side effects include sleepiness, daytime grogginess, balance problems, dizziness.   We will do an MRI brain with and without contrast and an MRA head to look at your brain blood vessels.   We will call you with the results. We will do blood work today.   Please reduce gabapentin to 300 mg three times a day.

## 2014-09-07 ENCOUNTER — Telehealth: Payer: Self-pay

## 2014-09-07 LAB — COMPREHENSIVE METABOLIC PANEL WITH GFR
ALT: 11 IU/L (ref 0–32)
AST: 15 IU/L (ref 0–40)
Albumin/Globulin Ratio: 2.1 (ref 1.1–2.5)
Albumin: 4.2 g/dL (ref 3.6–4.8)
Alkaline Phosphatase: 90 IU/L (ref 39–117)
BUN/Creatinine Ratio: 29 — ABNORMAL HIGH (ref 11–26)
BUN: 18 mg/dL (ref 8–27)
Bilirubin Total: <0.2 mg/dL (ref 0.0–1.2)
CO2: 26 mmol/L (ref 18–29)
Calcium: 9.7 mg/dL (ref 8.7–10.3)
Chloride: 100 mmol/L (ref 97–108)
Creatinine, Ser: 0.63 mg/dL (ref 0.57–1.00)
GFR calc Af Amer: 109 mL/min/1.73
GFR calc non Af Amer: 94 mL/min/1.73
Globulin, Total: 2 g/dL (ref 1.5–4.5)
Glucose: 91 mg/dL (ref 65–99)
Potassium: 4.4 mmol/L (ref 3.5–5.2)
Sodium: 143 mmol/L (ref 134–144)
Total Protein: 6.2 g/dL (ref 6.0–8.5)

## 2014-09-07 LAB — CBC WITH DIFFERENTIAL/PLATELET
Basophils Absolute: 0.1 x10E3/uL (ref 0.0–0.2)
Basos: 1 %
EOS (ABSOLUTE): 0.1 x10E3/uL (ref 0.0–0.4)
Eos: 2 %
Hematocrit: 36.8 % (ref 34.0–46.6)
Hemoglobin: 12.9 g/dL (ref 11.1–15.9)
Immature Grans (Abs): 0 x10E3/uL (ref 0.0–0.1)
Immature Granulocytes: 0 %
Lymphocytes Absolute: 2 x10E3/uL (ref 0.7–3.1)
Lymphs: 33 %
MCH: 30.4 pg (ref 26.6–33.0)
MCHC: 35.1 g/dL (ref 31.5–35.7)
MCV: 87 fL (ref 79–97)
Monocytes Absolute: 0.4 x10E3/uL (ref 0.1–0.9)
Monocytes: 7 %
Neutrophils Absolute: 3.4 x10E3/uL (ref 1.4–7.0)
Neutrophils: 57 %
Platelets: 246 x10E3/uL (ref 150–379)
RBC: 4.24 x10E6/uL (ref 3.77–5.28)
RDW: 13 % (ref 12.3–15.4)
WBC: 6 x10E3/uL (ref 3.4–10.8)

## 2014-09-07 NOTE — Progress Notes (Signed)
Quick Note:  Labs are fine, pls let her know, thx sa ______

## 2014-09-07 NOTE — Telephone Encounter (Signed)
I spoke with patient and she is aware of lab results/.

## 2014-09-07 NOTE — Telephone Encounter (Signed)
-----   Message from Star Age, MD sent at 09/07/2014  8:35 AM EDT ----- Labs are fine, pls let her know, thx sa

## 2014-09-20 ENCOUNTER — Telehealth: Payer: Self-pay | Admitting: Neurology

## 2014-09-20 NOTE — Telephone Encounter (Signed)
I tried to call patient but she picked up phone and didn't answer. I called back a few minutes later and no answer. I left message that [place] has tried to call her and left their number to call back and schedule.

## 2014-09-20 NOTE — Telephone Encounter (Signed)
Patient wants to know when she is going to be scheduled for her MRI.

## 2014-09-30 ENCOUNTER — Telehealth: Payer: Self-pay

## 2014-09-30 ENCOUNTER — Telehealth: Payer: Self-pay | Admitting: Neurology

## 2014-09-30 DIAGNOSIS — F419 Anxiety disorder, unspecified: Secondary | ICD-10-CM

## 2014-09-30 MED ORDER — ALPRAZOLAM 0.5 MG PO TABS: ORAL_TABLET | ORAL | Status: DC

## 2014-09-30 NOTE — Telephone Encounter (Signed)
Ok

## 2014-09-30 NOTE — Telephone Encounter (Signed)
I spoke to patient to advise her of xanax Rx. Dr. Rexene Alberts wrote patient for 2tab, but patient stated that she needed 3 tabs at last MRI. Dr. Rexene Alberts gave verbal orders to void last Rx of xanax and sign out a xanax packet for the patient (3, 0.5 tabs). Patient requested that daughter pick up Rx Floyde Parkins on [facility]). She is aware daughter must bring photo ID.

## 2014-09-30 NOTE — Telephone Encounter (Signed)
Rx for Xanax can be faxed to pharmacy.

## 2014-09-30 NOTE — Telephone Encounter (Signed)
Patient states that she is taking Tegretol 100mg  BID now. She is taking gabapentin 300mg  BID and is wondering if you want her to taper off of gabapentin any further?

## 2014-09-30 NOTE — Telephone Encounter (Signed)
Patient is calling because she is scheduled for an MRI on 10-11-14 at [facility]. Can patient get 3 Xanax pills from our office to take before the MRI due to anxiety?  Please call and advise. Thank you.

## 2014-10-01 NOTE — Telephone Encounter (Signed)
If she has been on tegretol at the twice daily dosing for about 2 weeks and feels improved in her pain, she can further lower her gabapentin to 300 mg just at night.

## 2014-10-01 NOTE — Telephone Encounter (Signed)
I spoke to patient and she is aware of recommendation below and will decrease gabapentin to 300mg  at night.

## 2014-10-11 ENCOUNTER — Ambulatory Visit
Admission: RE | Admit: 2014-10-11 | Discharge: 2014-10-11 | Disposition: A | Discharge status: Home / Self Care | Payer: Medicare Other | Source: Ambulatory Visit | Attending: Neurology | Admitting: Neurology

## 2014-10-11 DIAGNOSIS — G243 Spasmodic torticollis: Secondary | ICD-10-CM | POA: Diagnosis not present

## 2014-10-11 DIAGNOSIS — G501 Atypical facial pain: Secondary | ICD-10-CM

## 2014-10-11 DIAGNOSIS — G252 Other specified forms of tremor: Secondary | ICD-10-CM | POA: Diagnosis not present

## 2014-10-11 MED ORDER — GADOBENATE DIMEGLUMINE 529 MG/ML IV SOLN
12.0000 mL | Freq: Once | INTRAVENOUS | Status: AC | PRN
  Administered 2014-10-11: 12 mL via INTRAVENOUS

## 2014-10-12 NOTE — Progress Notes (Signed)
Quick Note:  Please call and advise the patient that the recent scans we did were within normal limits. We did a brain MRI w and wo contrast and a brain MRA (brain blood vessel evaluation) and findings were reported as normal, which of course is reassuring. In particular, there were no acute findings, such as a stroke, or mass or blood products. No further action is required on this test at this time.   Star Age, MD, PhD   ______

## 2014-10-12 NOTE — Progress Notes (Signed)
Quick Note:  I called pt and relayed the results of her MRI and MRA. Normal findings. Pt did not have f/u appt set up and she will call back to schedule 3 month appt. ______

## 2014-10-29 ENCOUNTER — Encounter: Payer: Self-pay | Admitting: *Deleted

## 2014-11-03 ENCOUNTER — Ambulatory Visit (INDEPENDENT_AMBULATORY_CARE_PROVIDER_SITE_OTHER): Payer: Medicare Other | Admitting: Cardiology

## 2014-11-03 ENCOUNTER — Encounter: Payer: Self-pay | Admitting: Cardiology

## 2014-11-03 VITALS — BP 116/70 | HR 64 | Ht 63.0 in | Wt 136.0 lb

## 2014-11-03 DIAGNOSIS — R0602 Shortness of breath: Secondary | ICD-10-CM | POA: Diagnosis not present

## 2014-11-03 DIAGNOSIS — R0789 Other chest pain: Secondary | ICD-10-CM | POA: Diagnosis not present

## 2014-11-03 DIAGNOSIS — Z136 Encounter for screening for cardiovascular disorders: Secondary | ICD-10-CM

## 2014-11-03 DIAGNOSIS — R9431 Abnormal electrocardiogram [ECG] [EKG]: Secondary | ICD-10-CM

## 2014-11-03 NOTE — Patient Instructions (Signed)
Your physician has requested that you have an echocardiogram. Echocardiography is a painless test that uses sound waves to create images of your heart. It provides your doctor with information about the size and shape of your heart and how well your heart's chambers and valves are working. This procedure takes approximately one hour. There are no restrictions for this procedure. Office will contact with results via phone or letter.   Continue all current medications. Follow up pending test results from above.

## 2014-11-03 NOTE — Progress Notes (Signed)
Clinical Summary [name] is a 64 y.o.female seen today as a new patient for the following medical problems.  1. SOB - started appoximately 4 months ago. Primarily occurs with mild to moderate activity, for example increased DOE walking up 1 flight of stairs at home.  - occas chest pressure. Notes some occasional palpitations, last just a few seconds. No LE edema. No orthopnea - no coughing, no wheezing. Smoked 10 years, quit 1977. Some secondhand smoke exposure.    2. Acid reflux - burning feeling midchest, better with tums. Typically comes on with eating or laying down, sleeps on wedge.    Fibromyalgia Past Medical History  Diagnosis Date  . GERD (gastroesophageal reflux disease)   . PONV (postoperative nausea and vomiting)   . Anxiety     hx panic attacks;  no meds  . History of kidney stones   . 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 Interstitial cystitis   . Frequency of urination   . Urgency of urination   . Nocturia   . Arthritis NECK AND SHOULDERS  . Chronic facial pain SECONDARY TO NERVE PALSY--  TAKES GABAPENTIN  . H/O hiatal hernia      Allergies  Allergen Reactions  . Milk-Related Compounds Other (See Comments)    DAIRY PRODUCTS--- SCRATCHY THROAT/ HX SINUS INFECTION  . Bactrim Hives  . Sulfa Antibiotics Hives     Current Outpatient Prescriptions  Medication Sig Dispense Refill  . calcitonin, salmon, (MIACALCIN/FORTICAL) 200 UNIT/ACT nasal spray Place 1 spray into the nose daily.     . carbamazepine (TEGRETOL XR) 100 MG 12 hr tablet Take 1 pill each bedtime for 2 weeks, then 1 pill 2 times a day. 60 tablet 5  . Cholecalciferol (VITAMIN D-3) 5000 UNITS TABS Take 1 capsule by mouth daily.    . fluticasone (FLONASE) 50 MCG/ACT nasal spray Place 2 sprays into the nose as needed.     . gabapentin (NEURONTIN) 300 MG capsule Take 300 mg by mouth at bedtime.     . Multiple Vitamins-Minerals (MULTIVITAMIN WITH MINERALS) tablet Take 1 tablet by  mouth daily.     . Omega-3 Fatty Acids (FISH OIL) 1200 MG CAPS Take 4 capsules by mouth daily.    . pentosan polysulfate (ELMIRON) 100 MG capsule Take 100 mg by mouth 2 (two) times daily.    . sucralfate (CARAFATE) 1 GM/10ML suspension Take 10 mLs (1 g total) by mouth 2 (two) times daily. 420 mL 6   No current facility-administered medications for this visit.     Past Surgical History  Procedure Laterality Date  . Bunionectomy  SEPT 2005    LEFT FOOT  . Pubovaginal sling  04/05/2011    Procedure: Gaynelle Arabian;  Surgeon: Ailene Rud, MD;  Location: [facility];  Service: Urology;  Laterality: N/A;  ANTERIOR UPHOLD LITE   AND CYSTOSCOPY  . Abdominal hysterectomy  1987    AND BURCH PROCEDURE  (BLADDER SUSPENSION)  . Tubal ligation  1980  . Nasal sinus surgery  DEC 2009  . Breast reduction surgery  2009    BILATERAL  . Attempted ureteroscopic stone extraction  2003  . Extracorporeal shock wave lithotripsy  2002    RIGHT SIDE  . Right shoulder surgery  DEC 2005  . Bladder sling procedure  2005  . Laparoscopy  03/03/2012    Procedure: LAPAROSCOPY OPERATIVE;  Surgeon: Luz Lex, MD;  Location: [facility];  Service: Gynecology;  Laterality: N/A;  . Laparoscopic lysis of adhesions  03/03/2012    Procedure: LAPAROSCOPIC LYSIS OF ADHESIONS;  Surgeon: Luz Lex, MD;  Location: [facility];  Service: Gynecology;  Laterality: N/A;  . Vaginal prolapse repair  03/03/2012    Procedure: VAGINAL VAULT SUSPENSION;  Surgeon: Ailene Rud, MD;  Location: [facility];  Service: Urology;  Laterality: N/A;  repair with xenform with ileococcygeus repair  . Rectocele repair N/A 08/18/2012    Procedure: REPAIR POSTERIOR VAGINAL WALL SCAR AND VAGINOPLASTY ;  Surgeon: Ailene Rud, MD;  Location: [facility];  Service: Urology;  Laterality: N/A;     Allergies  Allergen Reactions  . Milk-Related  Compounds Other (See Comments)    DAIRY PRODUCTS--- SCRATCHY THROAT/ HX SINUS INFECTION  . Bactrim Hives  . Sulfa Antibiotics Hives      Family History  Problem Relation Age of Onset  . Lymphoma Mother   . Heart disease Father   . Heart failure Father   . Colon cancer Neg Hx   . Esophageal cancer Neg Hx   . Rectal cancer Neg Hx   . Stomach cancer Neg Hx      Social History [name] reports that she quit smoking about 39 years ago. She has never used smokeless tobacco. [name] reports that she does not drink alcohol.   Review of Systems CONSTITUTIONAL: No weight loss, fever, chills, weakness or fatigue.  HEENT: Eyes: No visual loss, blurred vision, double vision or yellow sclerae.No hearing loss, sneezing, congestion, runny nose or sore throat.  SKIN: No rash or itching.  CARDIOVASCULAR: per HPI RESPIRATORY: No cough or sputum.  GASTROINTESTINAL: per HPI GENITOURINARY: No burning on urination, no polyuria NEUROLOGICAL: No headache, dizziness, syncope, paralysis, ataxia, numbness or tingling in the extremities. No change in bowel or bladder control.  MUSCULOSKELETAL: No muscle, back pain, joint pain or stiffness.  LYMPHATICS: No enlarged nodes. No history of splenectomy.  PSYCHIATRIC: No history of depression or anxiety.  ENDOCRINOLOGIC: No reports of sweating, cold or heat intolerance. No polyuria or polydipsia.  Marland Kitchen   Physical Examination Filed Vitals:   11/03/14 1445  BP: 116/70  Pulse: 64   Filed Vitals:   11/03/14 1445  Height: 5\' 3"  (1.6 m)  Weight: 136 lb (61.689 kg)    Gen: resting comfortably, no acute distress HEENT: no scleral icterus, pupils equal round and reactive, no palptable cervical adenopathy,  CV: RRR, no m/r/g, no JVD Resp: Clear to auscultation bilaterally GI: abdomen is soft, non-tender, non-distended, normal bowel sounds, no hepatosplenomegaly MSK: extremities are warm, no edema.  Skin: warm, no rash Neuro:  no focal  deficits Psych: appropriate affect     Assessment and Plan   1. SOB/DOE - unclear etiology at this time, potentially cardiac. Will obtain echo initially to evaluate for cardiac dysfunction, pending results likely pursue exercise stress test.    F/u pending test results     Arnoldo Lenis, M.D.

## 2014-11-04 DIAGNOSIS — R0602 Shortness of breath: Secondary | ICD-10-CM | POA: Insufficient documentation

## 2014-12-01 ENCOUNTER — Other Ambulatory Visit: Payer: Self-pay

## 2014-12-01 ENCOUNTER — Ambulatory Visit (INDEPENDENT_AMBULATORY_CARE_PROVIDER_SITE_OTHER): Payer: Medicare Other

## 2014-12-01 DIAGNOSIS — R0602 Shortness of breath: Secondary | ICD-10-CM | POA: Diagnosis not present

## 2014-12-02 ENCOUNTER — Telehealth: Payer: Self-pay

## 2014-12-02 ENCOUNTER — Encounter: Payer: Self-pay | Admitting: *Deleted

## 2014-12-02 DIAGNOSIS — R0602 Shortness of breath: Secondary | ICD-10-CM

## 2014-12-02 NOTE — Telephone Encounter (Signed)
PT made aware of her results. Will put order in for GXT and call PT with the date.

## 2014-12-02 NOTE — Telephone Encounter (Signed)
-----   Message from Arnoldo Lenis, MD sent at 12/01/2014  1:01 PM EDT ----- Echo overall looks good, her heart function is normal. I would like to get a GXT to evaluate her for possible blockages in the arteries of her heart as the cause of her symptoms  Zandra Abts MD

## 2014-12-09 ENCOUNTER — Encounter (HOSPITAL_COMMUNITY): Payer: Self-pay

## 2014-12-09 ENCOUNTER — Ambulatory Visit (HOSPITAL_COMMUNITY)
Admission: RE | Admit: 2014-12-09 | Discharge: 2014-12-09 | Disposition: A | Discharge status: Home / Self Care | Payer: Medicare Other | Source: Ambulatory Visit | Attending: Cardiology | Admitting: Cardiology

## 2014-12-09 DIAGNOSIS — R0602 Shortness of breath: Secondary | ICD-10-CM | POA: Diagnosis not present

## 2014-12-10 LAB — EXERCISE TOLERANCE TEST
CHL CUP MPHR: 154 {beats}/min
CHL RATE OF PERCEIVED EXERTION: 15
CSEPED: 6 min
CSEPPHR: 162 {beats}/min
Estimated workload: 7.2 METS
Exercise duration (sec): 6 s
Percent HR: 105 %
Rest HR: 55 {beats}/min

## 2015-07-14 DIAGNOSIS — W57XXXA Bitten or stung by nonvenomous insect and other nonvenomous arthropods, initial encounter: Secondary | ICD-10-CM | POA: Diagnosis not present

## 2015-07-14 DIAGNOSIS — Z299 Encounter for prophylactic measures, unspecified: Secondary | ICD-10-CM | POA: Diagnosis not present

## 2015-07-14 DIAGNOSIS — S00469A Insect bite (nonvenomous) of unspecified ear, initial encounter: Secondary | ICD-10-CM | POA: Diagnosis not present

## 2015-07-14 DIAGNOSIS — J310 Chronic rhinitis: Secondary | ICD-10-CM | POA: Diagnosis not present

## 2015-07-14 DIAGNOSIS — Z87891 Personal history of nicotine dependence: Secondary | ICD-10-CM | POA: Diagnosis not present

## 2015-07-21 DIAGNOSIS — J329 Chronic sinusitis, unspecified: Secondary | ICD-10-CM | POA: Diagnosis not present

## 2015-07-21 DIAGNOSIS — E7800 Pure hypercholesterolemia, unspecified: Secondary | ICD-10-CM | POA: Diagnosis not present

## 2015-07-21 DIAGNOSIS — Z87891 Personal history of nicotine dependence: Secondary | ICD-10-CM | POA: Diagnosis not present

## 2015-07-21 DIAGNOSIS — Z299 Encounter for prophylactic measures, unspecified: Secondary | ICD-10-CM | POA: Diagnosis not present

## 2015-09-13 DIAGNOSIS — N3010 Interstitial cystitis (chronic) without hematuria: Secondary | ICD-10-CM | POA: Diagnosis not present

## 2015-09-13 DIAGNOSIS — Z0000 Encounter for general adult medical examination without abnormal findings: Secondary | ICD-10-CM | POA: Diagnosis not present

## 2015-09-13 DIAGNOSIS — N3000 Acute cystitis without hematuria: Secondary | ICD-10-CM | POA: Diagnosis not present

## 2015-09-15 DIAGNOSIS — Z0000 Encounter for general adult medical examination without abnormal findings: Secondary | ICD-10-CM | POA: Diagnosis not present

## 2015-09-15 DIAGNOSIS — N3010 Interstitial cystitis (chronic) without hematuria: Secondary | ICD-10-CM | POA: Diagnosis not present

## 2015-09-15 DIAGNOSIS — N3000 Acute cystitis without hematuria: Secondary | ICD-10-CM | POA: Diagnosis not present

## 2015-09-20 DIAGNOSIS — Z0000 Encounter for general adult medical examination without abnormal findings: Secondary | ICD-10-CM | POA: Diagnosis not present

## 2015-09-20 DIAGNOSIS — N3010 Interstitial cystitis (chronic) without hematuria: Secondary | ICD-10-CM | POA: Diagnosis not present

## 2015-09-23 DIAGNOSIS — N3010 Interstitial cystitis (chronic) without hematuria: Secondary | ICD-10-CM | POA: Diagnosis not present

## 2015-09-23 DIAGNOSIS — Z0000 Encounter for general adult medical examination without abnormal findings: Secondary | ICD-10-CM | POA: Diagnosis not present

## 2015-09-27 DIAGNOSIS — N3010 Interstitial cystitis (chronic) without hematuria: Secondary | ICD-10-CM | POA: Diagnosis not present

## 2015-09-27 DIAGNOSIS — N3000 Acute cystitis without hematuria: Secondary | ICD-10-CM | POA: Diagnosis not present

## 2015-09-27 DIAGNOSIS — Z0000 Encounter for general adult medical examination without abnormal findings: Secondary | ICD-10-CM | POA: Diagnosis not present

## 2015-09-30 DIAGNOSIS — N3010 Interstitial cystitis (chronic) without hematuria: Secondary | ICD-10-CM | POA: Diagnosis not present

## 2015-10-06 DIAGNOSIS — N3000 Acute cystitis without hematuria: Secondary | ICD-10-CM | POA: Diagnosis not present

## 2015-11-14 DIAGNOSIS — H1851 Endothelial corneal dystrophy: Secondary | ICD-10-CM | POA: Diagnosis not present

## 2015-11-14 DIAGNOSIS — H2513 Age-related nuclear cataract, bilateral: Secondary | ICD-10-CM | POA: Diagnosis not present

## 2015-11-14 DIAGNOSIS — H40013 Open angle with borderline findings, low risk, bilateral: Secondary | ICD-10-CM | POA: Diagnosis not present

## 2016-01-10 DIAGNOSIS — Z6823 Body mass index (BMI) 23.0-23.9, adult: Secondary | ICD-10-CM | POA: Diagnosis not present

## 2016-01-10 DIAGNOSIS — E7800 Pure hypercholesterolemia, unspecified: Secondary | ICD-10-CM | POA: Diagnosis not present

## 2016-01-10 DIAGNOSIS — M810 Age-related osteoporosis without current pathological fracture: Secondary | ICD-10-CM | POA: Diagnosis not present

## 2016-01-10 DIAGNOSIS — J329 Chronic sinusitis, unspecified: Secondary | ICD-10-CM | POA: Diagnosis not present

## 2016-01-31 DIAGNOSIS — N3010 Interstitial cystitis (chronic) without hematuria: Secondary | ICD-10-CM | POA: Diagnosis not present

## 2016-01-31 DIAGNOSIS — R102 Pelvic and perineal pain: Secondary | ICD-10-CM | POA: Diagnosis not present

## 2016-01-31 DIAGNOSIS — R109 Unspecified abdominal pain: Secondary | ICD-10-CM | POA: Diagnosis not present

## 2016-01-31 DIAGNOSIS — R309 Painful micturition, unspecified: Secondary | ICD-10-CM | POA: Diagnosis not present

## 2016-02-07 DIAGNOSIS — R109 Unspecified abdominal pain: Secondary | ICD-10-CM | POA: Diagnosis not present

## 2016-02-09 DIAGNOSIS — R102 Pelvic and perineal pain: Secondary | ICD-10-CM | POA: Diagnosis not present

## 2016-02-09 DIAGNOSIS — R109 Unspecified abdominal pain: Secondary | ICD-10-CM | POA: Diagnosis not present

## 2016-03-09 DIAGNOSIS — N3010 Interstitial cystitis (chronic) without hematuria: Secondary | ICD-10-CM | POA: Diagnosis not present

## 2016-03-09 DIAGNOSIS — N362 Urethral caruncle: Secondary | ICD-10-CM | POA: Diagnosis not present

## 2016-04-17 DIAGNOSIS — Z1231 Encounter for screening mammogram for malignant neoplasm of breast: Secondary | ICD-10-CM | POA: Diagnosis not present

## 2016-04-25 DIAGNOSIS — Z7189 Other specified counseling: Secondary | ICD-10-CM | POA: Diagnosis not present

## 2016-04-25 DIAGNOSIS — Z79899 Other long term (current) drug therapy: Secondary | ICD-10-CM | POA: Diagnosis not present

## 2016-04-25 DIAGNOSIS — E7800 Pure hypercholesterolemia, unspecified: Secondary | ICD-10-CM | POA: Diagnosis not present

## 2016-04-25 DIAGNOSIS — Z0000 Encounter for general adult medical examination without abnormal findings: Secondary | ICD-10-CM | POA: Diagnosis not present

## 2016-04-25 DIAGNOSIS — R5383 Other fatigue: Secondary | ICD-10-CM | POA: Diagnosis not present

## 2016-04-25 DIAGNOSIS — Z1211 Encounter for screening for malignant neoplasm of colon: Secondary | ICD-10-CM | POA: Diagnosis not present

## 2016-04-25 DIAGNOSIS — Z299 Encounter for prophylactic measures, unspecified: Secondary | ICD-10-CM | POA: Diagnosis not present

## 2016-04-25 DIAGNOSIS — Z6824 Body mass index (BMI) 24.0-24.9, adult: Secondary | ICD-10-CM | POA: Diagnosis not present

## 2016-04-25 DIAGNOSIS — Z1389 Encounter for screening for other disorder: Secondary | ICD-10-CM | POA: Diagnosis not present

## 2016-06-07 DIAGNOSIS — E7800 Pure hypercholesterolemia, unspecified: Secondary | ICD-10-CM | POA: Diagnosis not present

## 2016-06-07 DIAGNOSIS — Z713 Dietary counseling and surveillance: Secondary | ICD-10-CM | POA: Diagnosis not present

## 2016-06-07 DIAGNOSIS — Z6824 Body mass index (BMI) 24.0-24.9, adult: Secondary | ICD-10-CM | POA: Diagnosis not present

## 2016-06-07 DIAGNOSIS — Z299 Encounter for prophylactic measures, unspecified: Secondary | ICD-10-CM | POA: Diagnosis not present

## 2016-06-07 DIAGNOSIS — J069 Acute upper respiratory infection, unspecified: Secondary | ICD-10-CM | POA: Diagnosis not present

## 2016-06-07 DIAGNOSIS — R509 Fever, unspecified: Secondary | ICD-10-CM | POA: Diagnosis not present

## 2016-06-07 DIAGNOSIS — Z87891 Personal history of nicotine dependence: Secondary | ICD-10-CM | POA: Diagnosis not present

## 2016-10-12 DIAGNOSIS — G250 Essential tremor: Secondary | ICD-10-CM | POA: Diagnosis not present

## 2016-10-12 DIAGNOSIS — Z6824 Body mass index (BMI) 24.0-24.9, adult: Secondary | ICD-10-CM | POA: Diagnosis not present

## 2016-10-12 DIAGNOSIS — K219 Gastro-esophageal reflux disease without esophagitis: Secondary | ICD-10-CM | POA: Diagnosis not present

## 2016-10-12 DIAGNOSIS — Z299 Encounter for prophylactic measures, unspecified: Secondary | ICD-10-CM | POA: Diagnosis not present

## 2016-10-12 DIAGNOSIS — W57XXXA Bitten or stung by nonvenomous insect and other nonvenomous arthropods, initial encounter: Secondary | ICD-10-CM | POA: Diagnosis not present

## 2016-10-12 DIAGNOSIS — R21 Rash and other nonspecific skin eruption: Secondary | ICD-10-CM | POA: Diagnosis not present

## 2016-10-12 DIAGNOSIS — Z713 Dietary counseling and surveillance: Secondary | ICD-10-CM | POA: Diagnosis not present

## 2016-10-12 DIAGNOSIS — E7800 Pure hypercholesterolemia, unspecified: Secondary | ICD-10-CM | POA: Diagnosis not present

## 2016-10-12 DIAGNOSIS — J45909 Unspecified asthma, uncomplicated: Secondary | ICD-10-CM | POA: Diagnosis not present

## 2016-10-30 DIAGNOSIS — M810 Age-related osteoporosis without current pathological fracture: Secondary | ICD-10-CM | POA: Diagnosis not present

## 2016-12-25 DIAGNOSIS — Z79899 Other long term (current) drug therapy: Secondary | ICD-10-CM | POA: Diagnosis not present

## 2017-01-15 ENCOUNTER — Other Ambulatory Visit: Payer: Self-pay | Admitting: Internal Medicine

## 2017-01-23 DIAGNOSIS — M810 Age-related osteoporosis without current pathological fracture: Secondary | ICD-10-CM | POA: Diagnosis not present

## 2017-04-29 DIAGNOSIS — Z0000 Encounter for general adult medical examination without abnormal findings: Secondary | ICD-10-CM | POA: Diagnosis not present

## 2017-04-29 DIAGNOSIS — Z299 Encounter for prophylactic measures, unspecified: Secondary | ICD-10-CM | POA: Diagnosis not present

## 2017-04-29 DIAGNOSIS — Z1339 Encounter for screening examination for other mental health and behavioral disorders: Secondary | ICD-10-CM | POA: Diagnosis not present

## 2017-04-29 DIAGNOSIS — Z6824 Body mass index (BMI) 24.0-24.9, adult: Secondary | ICD-10-CM | POA: Diagnosis not present

## 2017-04-29 DIAGNOSIS — Z1211 Encounter for screening for malignant neoplasm of colon: Secondary | ICD-10-CM | POA: Diagnosis not present

## 2017-04-29 DIAGNOSIS — E7800 Pure hypercholesterolemia, unspecified: Secondary | ICD-10-CM | POA: Diagnosis not present

## 2017-04-29 DIAGNOSIS — Z1331 Encounter for screening for depression: Secondary | ICD-10-CM | POA: Diagnosis not present

## 2017-04-29 DIAGNOSIS — Z7189 Other specified counseling: Secondary | ICD-10-CM | POA: Diagnosis not present

## 2017-04-29 DIAGNOSIS — Z79899 Other long term (current) drug therapy: Secondary | ICD-10-CM | POA: Diagnosis not present

## 2017-04-29 DIAGNOSIS — Z1231 Encounter for screening mammogram for malignant neoplasm of breast: Secondary | ICD-10-CM | POA: Diagnosis not present

## 2017-05-10 DIAGNOSIS — Z1231 Encounter for screening mammogram for malignant neoplasm of breast: Secondary | ICD-10-CM | POA: Diagnosis not present

## 2017-07-24 DIAGNOSIS — R5383 Other fatigue: Secondary | ICD-10-CM | POA: Diagnosis not present

## 2017-07-31 DIAGNOSIS — M810 Age-related osteoporosis without current pathological fracture: Secondary | ICD-10-CM | POA: Diagnosis not present

## 2017-08-07 DIAGNOSIS — R1310 Dysphagia, unspecified: Secondary | ICD-10-CM | POA: Diagnosis not present

## 2017-08-07 DIAGNOSIS — K625 Hemorrhage of anus and rectum: Secondary | ICD-10-CM | POA: Diagnosis not present

## 2017-08-07 DIAGNOSIS — E7800 Pure hypercholesterolemia, unspecified: Secondary | ICD-10-CM | POA: Diagnosis not present

## 2017-08-07 DIAGNOSIS — K644 Residual hemorrhoidal skin tags: Secondary | ICD-10-CM | POA: Diagnosis not present

## 2017-08-07 DIAGNOSIS — Z6824 Body mass index (BMI) 24.0-24.9, adult: Secondary | ICD-10-CM | POA: Diagnosis not present

## 2017-08-07 DIAGNOSIS — J45909 Unspecified asthma, uncomplicated: Secondary | ICD-10-CM | POA: Diagnosis not present

## 2017-08-07 DIAGNOSIS — Z299 Encounter for prophylactic measures, unspecified: Secondary | ICD-10-CM | POA: Diagnosis not present

## 2017-10-10 DIAGNOSIS — H35033 Hypertensive retinopathy, bilateral: Secondary | ICD-10-CM | POA: Diagnosis not present

## 2017-10-10 DIAGNOSIS — H43813 Vitreous degeneration, bilateral: Secondary | ICD-10-CM | POA: Diagnosis not present

## 2017-10-10 DIAGNOSIS — H1851 Endothelial corneal dystrophy: Secondary | ICD-10-CM | POA: Diagnosis not present

## 2017-11-11 DIAGNOSIS — K921 Melena: Secondary | ICD-10-CM | POA: Diagnosis not present

## 2017-11-11 DIAGNOSIS — K5900 Constipation, unspecified: Secondary | ICD-10-CM | POA: Diagnosis not present

## 2017-11-11 DIAGNOSIS — K219 Gastro-esophageal reflux disease without esophagitis: Secondary | ICD-10-CM | POA: Diagnosis not present

## 2017-11-11 DIAGNOSIS — R1310 Dysphagia, unspecified: Secondary | ICD-10-CM | POA: Diagnosis not present

## 2018-01-15 DIAGNOSIS — K449 Diaphragmatic hernia without obstruction or gangrene: Secondary | ICD-10-CM | POA: Diagnosis not present

## 2018-01-15 DIAGNOSIS — K219 Gastro-esophageal reflux disease without esophagitis: Secondary | ICD-10-CM | POA: Diagnosis not present

## 2018-01-15 DIAGNOSIS — K5900 Constipation, unspecified: Secondary | ICD-10-CM | POA: Diagnosis not present

## 2018-01-15 DIAGNOSIS — K640 First degree hemorrhoids: Secondary | ICD-10-CM | POA: Diagnosis not present

## 2018-01-15 DIAGNOSIS — K644 Residual hemorrhoidal skin tags: Secondary | ICD-10-CM | POA: Diagnosis not present

## 2018-01-15 DIAGNOSIS — Z8719 Personal history of other diseases of the digestive system: Secondary | ICD-10-CM | POA: Diagnosis not present

## 2018-01-15 DIAGNOSIS — R1312 Dysphagia, oropharyngeal phase: Secondary | ICD-10-CM | POA: Diagnosis not present

## 2018-01-15 DIAGNOSIS — G500 Trigeminal neuralgia: Secondary | ICD-10-CM | POA: Diagnosis not present

## 2018-01-15 DIAGNOSIS — K921 Melena: Secondary | ICD-10-CM | POA: Diagnosis not present

## 2018-01-15 DIAGNOSIS — Z79899 Other long term (current) drug therapy: Secondary | ICD-10-CM | POA: Diagnosis not present

## 2018-01-15 DIAGNOSIS — R1310 Dysphagia, unspecified: Secondary | ICD-10-CM | POA: Diagnosis not present

## 2018-02-25 DIAGNOSIS — M810 Age-related osteoporosis without current pathological fracture: Secondary | ICD-10-CM | POA: Diagnosis not present

## 2018-03-05 DIAGNOSIS — M810 Age-related osteoporosis without current pathological fracture: Secondary | ICD-10-CM | POA: Diagnosis not present

## 2018-05-05 DIAGNOSIS — Z0000 Encounter for general adult medical examination without abnormal findings: Secondary | ICD-10-CM | POA: Diagnosis not present

## 2018-05-05 DIAGNOSIS — Z299 Encounter for prophylactic measures, unspecified: Secondary | ICD-10-CM | POA: Diagnosis not present

## 2018-05-05 DIAGNOSIS — Z79899 Other long term (current) drug therapy: Secondary | ICD-10-CM | POA: Diagnosis not present

## 2018-05-05 DIAGNOSIS — E7800 Pure hypercholesterolemia, unspecified: Secondary | ICD-10-CM | POA: Diagnosis not present

## 2018-05-05 DIAGNOSIS — R5383 Other fatigue: Secondary | ICD-10-CM | POA: Diagnosis not present

## 2018-05-05 DIAGNOSIS — Z6824 Body mass index (BMI) 24.0-24.9, adult: Secondary | ICD-10-CM | POA: Diagnosis not present

## 2018-05-05 DIAGNOSIS — Z7189 Other specified counseling: Secondary | ICD-10-CM | POA: Diagnosis not present

## 2018-05-05 DIAGNOSIS — Z1331 Encounter for screening for depression: Secondary | ICD-10-CM | POA: Diagnosis not present

## 2018-05-05 DIAGNOSIS — Z1339 Encounter for screening examination for other mental health and behavioral disorders: Secondary | ICD-10-CM | POA: Diagnosis not present

## 2018-06-03 DIAGNOSIS — J019 Acute sinusitis, unspecified: Secondary | ICD-10-CM | POA: Diagnosis not present

## 2018-06-03 DIAGNOSIS — Z299 Encounter for prophylactic measures, unspecified: Secondary | ICD-10-CM | POA: Diagnosis not present

## 2018-06-03 DIAGNOSIS — Z87891 Personal history of nicotine dependence: Secondary | ICD-10-CM | POA: Diagnosis not present

## 2018-06-03 DIAGNOSIS — Z6824 Body mass index (BMI) 24.0-24.9, adult: Secondary | ICD-10-CM | POA: Diagnosis not present

## 2018-06-10 DIAGNOSIS — J3489 Other specified disorders of nose and nasal sinuses: Secondary | ICD-10-CM | POA: Diagnosis not present

## 2018-06-10 DIAGNOSIS — H1851 Endothelial corneal dystrophy: Secondary | ICD-10-CM | POA: Diagnosis not present

## 2018-06-10 DIAGNOSIS — H401211 Low-tension glaucoma, right eye, mild stage: Secondary | ICD-10-CM | POA: Diagnosis not present

## 2018-06-10 DIAGNOSIS — H2513 Age-related nuclear cataract, bilateral: Secondary | ICD-10-CM | POA: Diagnosis not present

## 2018-06-17 DIAGNOSIS — Z1231 Encounter for screening mammogram for malignant neoplasm of breast: Secondary | ICD-10-CM | POA: Diagnosis not present

## 2018-07-14 DIAGNOSIS — J069 Acute upper respiratory infection, unspecified: Secondary | ICD-10-CM | POA: Diagnosis not present

## 2018-07-14 DIAGNOSIS — Z6824 Body mass index (BMI) 24.0-24.9, adult: Secondary | ICD-10-CM | POA: Diagnosis not present

## 2018-07-14 DIAGNOSIS — Z299 Encounter for prophylactic measures, unspecified: Secondary | ICD-10-CM | POA: Diagnosis not present

## 2018-09-25 DIAGNOSIS — M810 Age-related osteoporosis without current pathological fracture: Secondary | ICD-10-CM | POA: Diagnosis not present

## 2018-09-25 DIAGNOSIS — Z79899 Other long term (current) drug therapy: Secondary | ICD-10-CM | POA: Diagnosis not present

## 2018-10-21 DIAGNOSIS — H2513 Age-related nuclear cataract, bilateral: Secondary | ICD-10-CM | POA: Diagnosis not present

## 2018-10-21 DIAGNOSIS — H401211 Low-tension glaucoma, right eye, mild stage: Secondary | ICD-10-CM | POA: Diagnosis not present

## 2018-10-21 DIAGNOSIS — H1851 Endothelial corneal dystrophy: Secondary | ICD-10-CM | POA: Diagnosis not present

## 2018-10-21 DIAGNOSIS — G500 Trigeminal neuralgia: Secondary | ICD-10-CM | POA: Diagnosis not present

## 2018-11-18 DIAGNOSIS — L821 Other seborrheic keratosis: Secondary | ICD-10-CM | POA: Diagnosis not present

## 2018-11-18 DIAGNOSIS — H61001 Unspecified perichondritis of right external ear: Secondary | ICD-10-CM | POA: Diagnosis not present

## 2018-11-18 DIAGNOSIS — L578 Other skin changes due to chronic exposure to nonionizing radiation: Secondary | ICD-10-CM | POA: Diagnosis not present

## 2018-11-18 DIAGNOSIS — L820 Inflamed seborrheic keratosis: Secondary | ICD-10-CM | POA: Diagnosis not present

## 2019-03-11 DIAGNOSIS — Z299 Encounter for prophylactic measures, unspecified: Secondary | ICD-10-CM | POA: Diagnosis not present

## 2019-03-11 DIAGNOSIS — Z713 Dietary counseling and surveillance: Secondary | ICD-10-CM | POA: Diagnosis not present

## 2019-03-11 DIAGNOSIS — L989 Disorder of the skin and subcutaneous tissue, unspecified: Secondary | ICD-10-CM | POA: Diagnosis not present

## 2019-03-11 DIAGNOSIS — Z6823 Body mass index (BMI) 23.0-23.9, adult: Secondary | ICD-10-CM | POA: Diagnosis not present

## 2019-04-02 DIAGNOSIS — M810 Age-related osteoporosis without current pathological fracture: Secondary | ICD-10-CM | POA: Diagnosis not present

## 2019-04-02 DIAGNOSIS — Z299 Encounter for prophylactic measures, unspecified: Secondary | ICD-10-CM | POA: Diagnosis not present

## 2019-04-02 DIAGNOSIS — Z87891 Personal history of nicotine dependence: Secondary | ICD-10-CM | POA: Diagnosis not present

## 2019-04-02 DIAGNOSIS — G243 Spasmodic torticollis: Secondary | ICD-10-CM | POA: Diagnosis not present

## 2019-04-02 DIAGNOSIS — Z6823 Body mass index (BMI) 23.0-23.9, adult: Secondary | ICD-10-CM | POA: Diagnosis not present

## 2019-05-12 DIAGNOSIS — Z6823 Body mass index (BMI) 23.0-23.9, adult: Secondary | ICD-10-CM | POA: Diagnosis not present

## 2019-05-12 DIAGNOSIS — L309 Dermatitis, unspecified: Secondary | ICD-10-CM | POA: Diagnosis not present

## 2019-05-12 DIAGNOSIS — Z7189 Other specified counseling: Secondary | ICD-10-CM | POA: Diagnosis not present

## 2019-05-12 DIAGNOSIS — E7800 Pure hypercholesterolemia, unspecified: Secondary | ICD-10-CM | POA: Diagnosis not present

## 2019-05-12 DIAGNOSIS — Z299 Encounter for prophylactic measures, unspecified: Secondary | ICD-10-CM | POA: Diagnosis not present

## 2019-05-12 DIAGNOSIS — Z0000 Encounter for general adult medical examination without abnormal findings: Secondary | ICD-10-CM | POA: Diagnosis not present

## 2019-05-12 DIAGNOSIS — R5383 Other fatigue: Secondary | ICD-10-CM | POA: Diagnosis not present

## 2019-05-12 DIAGNOSIS — Z1339 Encounter for screening examination for other mental health and behavioral disorders: Secondary | ICD-10-CM | POA: Diagnosis not present

## 2019-05-12 DIAGNOSIS — Z1331 Encounter for screening for depression: Secondary | ICD-10-CM | POA: Diagnosis not present

## 2019-05-12 DIAGNOSIS — Z1211 Encounter for screening for malignant neoplasm of colon: Secondary | ICD-10-CM | POA: Diagnosis not present

## 2019-06-08 DIAGNOSIS — E28319 Asymptomatic premature menopause: Secondary | ICD-10-CM | POA: Diagnosis not present

## 2019-06-11 DIAGNOSIS — M25512 Pain in left shoulder: Secondary | ICD-10-CM | POA: Diagnosis not present

## 2019-06-11 DIAGNOSIS — M7542 Impingement syndrome of left shoulder: Secondary | ICD-10-CM | POA: Diagnosis not present

## 2019-12-24 DIAGNOSIS — H40022 Open angle with borderline findings, high risk, left eye: Secondary | ICD-10-CM | POA: Diagnosis not present

## 2019-12-24 DIAGNOSIS — G500 Trigeminal neuralgia: Secondary | ICD-10-CM | POA: Diagnosis not present

## 2019-12-24 DIAGNOSIS — H401211 Low-tension glaucoma, right eye, mild stage: Secondary | ICD-10-CM | POA: Diagnosis not present

## 2019-12-24 DIAGNOSIS — H2513 Age-related nuclear cataract, bilateral: Secondary | ICD-10-CM | POA: Diagnosis not present

## 2019-12-24 DIAGNOSIS — H43813 Vitreous degeneration, bilateral: Secondary | ICD-10-CM | POA: Diagnosis not present

## 2019-12-29 DIAGNOSIS — K219 Gastro-esophageal reflux disease without esophagitis: Secondary | ICD-10-CM | POA: Diagnosis not present

## 2019-12-29 DIAGNOSIS — Z299 Encounter for prophylactic measures, unspecified: Secondary | ICD-10-CM | POA: Diagnosis not present

## 2019-12-29 DIAGNOSIS — L309 Dermatitis, unspecified: Secondary | ICD-10-CM | POA: Diagnosis not present

## 2019-12-29 DIAGNOSIS — R0789 Other chest pain: Secondary | ICD-10-CM | POA: Diagnosis not present

## 2020-01-11 DIAGNOSIS — R042 Hemoptysis: Secondary | ICD-10-CM | POA: Diagnosis not present

## 2020-01-11 DIAGNOSIS — J180 Bronchopneumonia, unspecified organism: Secondary | ICD-10-CM | POA: Diagnosis not present

## 2020-01-11 DIAGNOSIS — D692 Other nonthrombocytopenic purpura: Secondary | ICD-10-CM | POA: Diagnosis not present

## 2020-01-11 DIAGNOSIS — R05 Cough: Secondary | ICD-10-CM | POA: Diagnosis not present

## 2020-01-11 DIAGNOSIS — Z299 Encounter for prophylactic measures, unspecified: Secondary | ICD-10-CM | POA: Diagnosis not present

## 2020-01-11 DIAGNOSIS — J189 Pneumonia, unspecified organism: Secondary | ICD-10-CM | POA: Diagnosis not present

## 2020-01-26 DIAGNOSIS — Z23 Encounter for immunization: Secondary | ICD-10-CM | POA: Diagnosis not present

## 2020-02-08 DIAGNOSIS — L309 Dermatitis, unspecified: Secondary | ICD-10-CM | POA: Diagnosis not present

## 2020-02-08 DIAGNOSIS — L814 Other melanin hyperpigmentation: Secondary | ICD-10-CM | POA: Diagnosis not present

## 2020-02-08 DIAGNOSIS — L570 Actinic keratosis: Secondary | ICD-10-CM | POA: Diagnosis not present

## 2020-02-08 DIAGNOSIS — L821 Other seborrheic keratosis: Secondary | ICD-10-CM | POA: Diagnosis not present

## 2020-02-11 DIAGNOSIS — R059 Cough, unspecified: Secondary | ICD-10-CM | POA: Diagnosis not present

## 2020-02-11 DIAGNOSIS — Z299 Encounter for prophylactic measures, unspecified: Secondary | ICD-10-CM | POA: Diagnosis not present

## 2020-02-11 DIAGNOSIS — Z713 Dietary counseling and surveillance: Secondary | ICD-10-CM | POA: Diagnosis not present

## 2020-02-11 DIAGNOSIS — Z6824 Body mass index (BMI) 24.0-24.9, adult: Secondary | ICD-10-CM | POA: Diagnosis not present

## 2020-04-15 DIAGNOSIS — M810 Age-related osteoporosis without current pathological fracture: Secondary | ICD-10-CM | POA: Diagnosis not present

## 2020-04-21 DIAGNOSIS — M810 Age-related osteoporosis without current pathological fracture: Secondary | ICD-10-CM | POA: Diagnosis not present

## 2020-05-13 DIAGNOSIS — Z1331 Encounter for screening for depression: Secondary | ICD-10-CM | POA: Diagnosis not present

## 2020-05-13 DIAGNOSIS — Z299 Encounter for prophylactic measures, unspecified: Secondary | ICD-10-CM | POA: Diagnosis not present

## 2020-05-13 DIAGNOSIS — Z1339 Encounter for screening examination for other mental health and behavioral disorders: Secondary | ICD-10-CM | POA: Diagnosis not present

## 2020-05-13 DIAGNOSIS — E7800 Pure hypercholesterolemia, unspecified: Secondary | ICD-10-CM | POA: Diagnosis not present

## 2020-05-13 DIAGNOSIS — Z79899 Other long term (current) drug therapy: Secondary | ICD-10-CM | POA: Diagnosis not present

## 2020-05-13 DIAGNOSIS — I10 Essential (primary) hypertension: Secondary | ICD-10-CM | POA: Diagnosis not present

## 2020-05-13 DIAGNOSIS — E559 Vitamin D deficiency, unspecified: Secondary | ICD-10-CM | POA: Diagnosis not present

## 2020-05-13 DIAGNOSIS — R5383 Other fatigue: Secondary | ICD-10-CM | POA: Diagnosis not present

## 2020-05-13 DIAGNOSIS — Z0000 Encounter for general adult medical examination without abnormal findings: Secondary | ICD-10-CM | POA: Diagnosis not present

## 2020-05-13 DIAGNOSIS — J309 Allergic rhinitis, unspecified: Secondary | ICD-10-CM | POA: Diagnosis not present

## 2020-05-13 DIAGNOSIS — Z7189 Other specified counseling: Secondary | ICD-10-CM | POA: Diagnosis not present

## 2020-06-30 DIAGNOSIS — H401211 Low-tension glaucoma, right eye, mild stage: Secondary | ICD-10-CM | POA: Diagnosis not present

## 2020-09-12 DIAGNOSIS — M778 Other enthesopathies, not elsewhere classified: Secondary | ICD-10-CM | POA: Diagnosis not present

## 2020-09-12 DIAGNOSIS — Z299 Encounter for prophylactic measures, unspecified: Secondary | ICD-10-CM | POA: Diagnosis not present

## 2020-09-12 DIAGNOSIS — I10 Essential (primary) hypertension: Secondary | ICD-10-CM | POA: Diagnosis not present

## 2020-09-12 DIAGNOSIS — M542 Cervicalgia: Secondary | ICD-10-CM | POA: Diagnosis not present

## 2020-09-12 DIAGNOSIS — J309 Allergic rhinitis, unspecified: Secondary | ICD-10-CM | POA: Diagnosis not present

## 2020-10-17 DIAGNOSIS — M810 Age-related osteoporosis without current pathological fracture: Secondary | ICD-10-CM | POA: Diagnosis not present

## 2020-10-26 DIAGNOSIS — M810 Age-related osteoporosis without current pathological fracture: Secondary | ICD-10-CM | POA: Diagnosis not present

## 2020-10-31 DIAGNOSIS — Z20822 Contact with and (suspected) exposure to covid-19: Secondary | ICD-10-CM | POA: Diagnosis not present

## 2020-11-01 DIAGNOSIS — G501 Atypical facial pain: Secondary | ICD-10-CM | POA: Diagnosis not present

## 2020-11-01 DIAGNOSIS — J310 Chronic rhinitis: Secondary | ICD-10-CM | POA: Diagnosis not present

## 2020-11-01 DIAGNOSIS — L739 Follicular disorder, unspecified: Secondary | ICD-10-CM | POA: Diagnosis not present

## 2020-11-01 DIAGNOSIS — R1310 Dysphagia, unspecified: Secondary | ICD-10-CM | POA: Diagnosis not present

## 2020-11-23 DIAGNOSIS — L570 Actinic keratosis: Secondary | ICD-10-CM | POA: Diagnosis not present

## 2020-12-07 DIAGNOSIS — H401211 Low-tension glaucoma, right eye, mild stage: Secondary | ICD-10-CM | POA: Diagnosis not present

## 2020-12-08 DIAGNOSIS — K224 Dyskinesia of esophagus: Secondary | ICD-10-CM | POA: Diagnosis not present

## 2020-12-08 DIAGNOSIS — R1310 Dysphagia, unspecified: Secondary | ICD-10-CM | POA: Diagnosis not present

## 2020-12-19 DIAGNOSIS — Z1231 Encounter for screening mammogram for malignant neoplasm of breast: Secondary | ICD-10-CM | POA: Diagnosis not present

## 2021-01-03 DIAGNOSIS — U071 COVID-19: Secondary | ICD-10-CM | POA: Diagnosis not present

## 2021-01-13 DIAGNOSIS — Z299 Encounter for prophylactic measures, unspecified: Secondary | ICD-10-CM | POA: Diagnosis not present

## 2021-01-13 DIAGNOSIS — N3010 Interstitial cystitis (chronic) without hematuria: Secondary | ICD-10-CM | POA: Diagnosis not present

## 2021-01-13 DIAGNOSIS — N390 Urinary tract infection, site not specified: Secondary | ICD-10-CM | POA: Diagnosis not present

## 2021-01-13 DIAGNOSIS — E119 Type 2 diabetes mellitus without complications: Secondary | ICD-10-CM | POA: Diagnosis not present

## 2021-01-25 DIAGNOSIS — K219 Gastro-esophageal reflux disease without esophagitis: Secondary | ICD-10-CM | POA: Diagnosis not present

## 2021-01-25 DIAGNOSIS — Z2821 Immunization not carried out because of patient refusal: Secondary | ICD-10-CM | POA: Diagnosis not present

## 2021-01-25 DIAGNOSIS — Z6824 Body mass index (BMI) 24.0-24.9, adult: Secondary | ICD-10-CM | POA: Diagnosis not present

## 2021-01-25 DIAGNOSIS — Z299 Encounter for prophylactic measures, unspecified: Secondary | ICD-10-CM | POA: Diagnosis not present

## 2021-01-25 DIAGNOSIS — I10 Essential (primary) hypertension: Secondary | ICD-10-CM | POA: Diagnosis not present

## 2021-01-25 DIAGNOSIS — D692 Other nonthrombocytopenic purpura: Secondary | ICD-10-CM | POA: Diagnosis not present

## 2021-01-25 DIAGNOSIS — Z87891 Personal history of nicotine dependence: Secondary | ICD-10-CM | POA: Diagnosis not present

## 2021-01-25 DIAGNOSIS — R413 Other amnesia: Secondary | ICD-10-CM | POA: Diagnosis not present

## 2021-01-31 DIAGNOSIS — G243 Spasmodic torticollis: Secondary | ICD-10-CM | POA: Diagnosis not present

## 2021-01-31 DIAGNOSIS — R519 Headache, unspecified: Secondary | ICD-10-CM | POA: Diagnosis not present

## 2021-03-06 DIAGNOSIS — Z20828 Contact with and (suspected) exposure to other viral communicable diseases: Secondary | ICD-10-CM | POA: Diagnosis not present

## 2021-03-08 DIAGNOSIS — R413 Other amnesia: Secondary | ICD-10-CM | POA: Diagnosis not present

## 2021-04-06 DIAGNOSIS — H18519 Endothelial corneal dystrophy, unspecified eye: Secondary | ICD-10-CM | POA: Diagnosis not present

## 2021-04-06 DIAGNOSIS — G500 Trigeminal neuralgia: Secondary | ICD-10-CM | POA: Diagnosis not present

## 2021-04-06 DIAGNOSIS — H401211 Low-tension glaucoma, right eye, mild stage: Secondary | ICD-10-CM | POA: Diagnosis not present

## 2021-04-06 DIAGNOSIS — H43813 Vitreous degeneration, bilateral: Secondary | ICD-10-CM | POA: Diagnosis not present

## 2021-04-11 DIAGNOSIS — Z299 Encounter for prophylactic measures, unspecified: Secondary | ICD-10-CM | POA: Diagnosis not present

## 2021-04-11 DIAGNOSIS — I10 Essential (primary) hypertension: Secondary | ICD-10-CM | POA: Diagnosis not present

## 2021-04-11 DIAGNOSIS — Z789 Other specified health status: Secondary | ICD-10-CM | POA: Diagnosis not present

## 2021-04-11 DIAGNOSIS — R4181 Age-related cognitive decline: Secondary | ICD-10-CM | POA: Diagnosis not present

## 2021-04-14 DIAGNOSIS — K5909 Other constipation: Secondary | ICD-10-CM | POA: Diagnosis not present

## 2021-04-14 DIAGNOSIS — K219 Gastro-esophageal reflux disease without esophagitis: Secondary | ICD-10-CM | POA: Diagnosis not present

## 2021-04-27 DIAGNOSIS — M810 Age-related osteoporosis without current pathological fracture: Secondary | ICD-10-CM | POA: Diagnosis not present

## 2021-05-03 DIAGNOSIS — R059 Cough, unspecified: Secondary | ICD-10-CM | POA: Diagnosis not present

## 2021-05-03 DIAGNOSIS — R042 Hemoptysis: Secondary | ICD-10-CM | POA: Diagnosis not present

## 2021-05-03 DIAGNOSIS — Z299 Encounter for prophylactic measures, unspecified: Secondary | ICD-10-CM | POA: Diagnosis not present

## 2021-05-03 DIAGNOSIS — Z6824 Body mass index (BMI) 24.0-24.9, adult: Secondary | ICD-10-CM | POA: Diagnosis not present

## 2021-05-03 DIAGNOSIS — J069 Acute upper respiratory infection, unspecified: Secondary | ICD-10-CM | POA: Diagnosis not present

## 2021-05-04 DIAGNOSIS — G243 Spasmodic torticollis: Secondary | ICD-10-CM | POA: Diagnosis not present

## 2021-05-16 DIAGNOSIS — M810 Age-related osteoporosis without current pathological fracture: Secondary | ICD-10-CM | POA: Diagnosis not present

## 2021-05-16 DIAGNOSIS — L578 Other skin changes due to chronic exposure to nonionizing radiation: Secondary | ICD-10-CM | POA: Diagnosis not present

## 2021-05-16 DIAGNOSIS — Z0000 Encounter for general adult medical examination without abnormal findings: Secondary | ICD-10-CM | POA: Diagnosis not present

## 2021-05-16 DIAGNOSIS — Z79899 Other long term (current) drug therapy: Secondary | ICD-10-CM | POA: Diagnosis not present

## 2021-05-16 DIAGNOSIS — Z87891 Personal history of nicotine dependence: Secondary | ICD-10-CM | POA: Diagnosis not present

## 2021-05-16 DIAGNOSIS — I10 Essential (primary) hypertension: Secondary | ICD-10-CM | POA: Diagnosis not present

## 2021-05-16 DIAGNOSIS — Z299 Encounter for prophylactic measures, unspecified: Secondary | ICD-10-CM | POA: Diagnosis not present

## 2021-05-16 DIAGNOSIS — E7800 Pure hypercholesterolemia, unspecified: Secondary | ICD-10-CM | POA: Diagnosis not present

## 2021-05-16 DIAGNOSIS — Z1331 Encounter for screening for depression: Secondary | ICD-10-CM | POA: Diagnosis not present

## 2021-05-16 DIAGNOSIS — Z1339 Encounter for screening examination for other mental health and behavioral disorders: Secondary | ICD-10-CM | POA: Diagnosis not present

## 2021-05-16 DIAGNOSIS — L821 Other seborrheic keratosis: Secondary | ICD-10-CM | POA: Diagnosis not present

## 2021-05-16 DIAGNOSIS — L820 Inflamed seborrheic keratosis: Secondary | ICD-10-CM | POA: Diagnosis not present

## 2021-05-16 DIAGNOSIS — R5383 Other fatigue: Secondary | ICD-10-CM | POA: Diagnosis not present

## 2021-05-16 DIAGNOSIS — Z6825 Body mass index (BMI) 25.0-25.9, adult: Secondary | ICD-10-CM | POA: Diagnosis not present

## 2021-05-16 DIAGNOSIS — Z7189 Other specified counseling: Secondary | ICD-10-CM | POA: Diagnosis not present

## 2021-05-31 DIAGNOSIS — M65332 Trigger finger, left middle finger: Secondary | ICD-10-CM | POA: Diagnosis not present

## 2021-06-01 DIAGNOSIS — R519 Headache, unspecified: Secondary | ICD-10-CM | POA: Diagnosis not present

## 2021-06-01 DIAGNOSIS — G243 Spasmodic torticollis: Secondary | ICD-10-CM | POA: Diagnosis not present

## 2021-06-02 DIAGNOSIS — Z20822 Contact with and (suspected) exposure to covid-19: Secondary | ICD-10-CM | POA: Diagnosis not present

## 2021-06-13 DIAGNOSIS — R1110 Vomiting, unspecified: Secondary | ICD-10-CM | POA: Diagnosis not present

## 2021-06-13 DIAGNOSIS — K219 Gastro-esophageal reflux disease without esophagitis: Secondary | ICD-10-CM | POA: Diagnosis not present

## 2021-06-14 DIAGNOSIS — K219 Gastro-esophageal reflux disease without esophagitis: Secondary | ICD-10-CM | POA: Diagnosis not present

## 2021-06-26 DIAGNOSIS — R531 Weakness: Secondary | ICD-10-CM | POA: Diagnosis not present

## 2021-06-26 DIAGNOSIS — E2839 Other primary ovarian failure: Secondary | ICD-10-CM | POA: Diagnosis not present

## 2021-06-26 DIAGNOSIS — M859 Disorder of bone density and structure, unspecified: Secondary | ICD-10-CM | POA: Diagnosis not present

## 2021-06-26 DIAGNOSIS — M79642 Pain in left hand: Secondary | ICD-10-CM | POA: Diagnosis not present

## 2021-06-26 DIAGNOSIS — M25642 Stiffness of left hand, not elsewhere classified: Secondary | ICD-10-CM | POA: Diagnosis not present

## 2021-06-26 DIAGNOSIS — M65332 Trigger finger, left middle finger: Secondary | ICD-10-CM | POA: Diagnosis not present

## 2021-06-26 DIAGNOSIS — Z79899 Other long term (current) drug therapy: Secondary | ICD-10-CM | POA: Diagnosis not present

## 2021-06-28 DIAGNOSIS — M65332 Trigger finger, left middle finger: Secondary | ICD-10-CM | POA: Diagnosis not present

## 2021-06-28 DIAGNOSIS — R531 Weakness: Secondary | ICD-10-CM | POA: Diagnosis not present

## 2021-06-28 DIAGNOSIS — M79642 Pain in left hand: Secondary | ICD-10-CM | POA: Diagnosis not present

## 2021-06-28 DIAGNOSIS — M25642 Stiffness of left hand, not elsewhere classified: Secondary | ICD-10-CM | POA: Diagnosis not present

## 2021-07-05 DIAGNOSIS — M79642 Pain in left hand: Secondary | ICD-10-CM | POA: Diagnosis not present

## 2021-07-05 DIAGNOSIS — M65332 Trigger finger, left middle finger: Secondary | ICD-10-CM | POA: Diagnosis not present

## 2021-07-05 DIAGNOSIS — M25642 Stiffness of left hand, not elsewhere classified: Secondary | ICD-10-CM | POA: Diagnosis not present

## 2021-07-05 DIAGNOSIS — R531 Weakness: Secondary | ICD-10-CM | POA: Diagnosis not present

## 2021-07-10 DIAGNOSIS — M79642 Pain in left hand: Secondary | ICD-10-CM | POA: Diagnosis not present

## 2021-07-10 DIAGNOSIS — M25642 Stiffness of left hand, not elsewhere classified: Secondary | ICD-10-CM | POA: Diagnosis not present

## 2021-07-10 DIAGNOSIS — Z20822 Contact with and (suspected) exposure to covid-19: Secondary | ICD-10-CM | POA: Diagnosis not present

## 2021-07-10 DIAGNOSIS — R531 Weakness: Secondary | ICD-10-CM | POA: Diagnosis not present

## 2021-07-10 DIAGNOSIS — M65332 Trigger finger, left middle finger: Secondary | ICD-10-CM | POA: Diagnosis not present

## 2021-07-17 DIAGNOSIS — M25642 Stiffness of left hand, not elsewhere classified: Secondary | ICD-10-CM | POA: Diagnosis not present

## 2021-07-17 DIAGNOSIS — M79642 Pain in left hand: Secondary | ICD-10-CM | POA: Diagnosis not present

## 2021-07-17 DIAGNOSIS — R531 Weakness: Secondary | ICD-10-CM | POA: Diagnosis not present

## 2021-07-17 DIAGNOSIS — M65332 Trigger finger, left middle finger: Secondary | ICD-10-CM | POA: Diagnosis not present

## 2021-07-26 DIAGNOSIS — R531 Weakness: Secondary | ICD-10-CM | POA: Diagnosis not present

## 2021-07-26 DIAGNOSIS — M25642 Stiffness of left hand, not elsewhere classified: Secondary | ICD-10-CM | POA: Diagnosis not present

## 2021-07-26 DIAGNOSIS — M65332 Trigger finger, left middle finger: Secondary | ICD-10-CM | POA: Diagnosis not present

## 2021-07-26 DIAGNOSIS — M79642 Pain in left hand: Secondary | ICD-10-CM | POA: Diagnosis not present

## 2021-08-02 DIAGNOSIS — M79642 Pain in left hand: Secondary | ICD-10-CM | POA: Diagnosis not present

## 2021-08-02 DIAGNOSIS — R531 Weakness: Secondary | ICD-10-CM | POA: Diagnosis not present

## 2021-08-02 DIAGNOSIS — M25642 Stiffness of left hand, not elsewhere classified: Secondary | ICD-10-CM | POA: Diagnosis not present

## 2021-08-02 DIAGNOSIS — M65332 Trigger finger, left middle finger: Secondary | ICD-10-CM | POA: Diagnosis not present

## 2021-08-04 DIAGNOSIS — Z1152 Encounter for screening for COVID-19: Secondary | ICD-10-CM | POA: Diagnosis not present

## 2021-08-04 DIAGNOSIS — Z20828 Contact with and (suspected) exposure to other viral communicable diseases: Secondary | ICD-10-CM | POA: Diagnosis not present

## 2021-08-08 DIAGNOSIS — H43813 Vitreous degeneration, bilateral: Secondary | ICD-10-CM | POA: Diagnosis not present

## 2021-08-08 DIAGNOSIS — H18519 Endothelial corneal dystrophy, unspecified eye: Secondary | ICD-10-CM | POA: Diagnosis not present

## 2021-08-08 DIAGNOSIS — H401211 Low-tension glaucoma, right eye, mild stage: Secondary | ICD-10-CM | POA: Diagnosis not present

## 2021-08-08 DIAGNOSIS — G500 Trigeminal neuralgia: Secondary | ICD-10-CM | POA: Diagnosis not present

## 2021-08-09 DIAGNOSIS — R531 Weakness: Secondary | ICD-10-CM | POA: Diagnosis not present

## 2021-08-09 DIAGNOSIS — M25642 Stiffness of left hand, not elsewhere classified: Secondary | ICD-10-CM | POA: Diagnosis not present

## 2021-08-09 DIAGNOSIS — M79642 Pain in left hand: Secondary | ICD-10-CM | POA: Diagnosis not present

## 2021-08-09 DIAGNOSIS — M65332 Trigger finger, left middle finger: Secondary | ICD-10-CM | POA: Diagnosis not present

## 2021-08-14 DIAGNOSIS — Z20822 Contact with and (suspected) exposure to covid-19: Secondary | ICD-10-CM | POA: Diagnosis not present

## 2021-08-23 DIAGNOSIS — I10 Essential (primary) hypertension: Secondary | ICD-10-CM | POA: Diagnosis not present

## 2021-08-23 DIAGNOSIS — M542 Cervicalgia: Secondary | ICD-10-CM | POA: Diagnosis not present

## 2021-08-23 DIAGNOSIS — Z789 Other specified health status: Secondary | ICD-10-CM | POA: Diagnosis not present

## 2021-08-23 DIAGNOSIS — Z299 Encounter for prophylactic measures, unspecified: Secondary | ICD-10-CM | POA: Diagnosis not present

## 2021-08-23 DIAGNOSIS — L239 Allergic contact dermatitis, unspecified cause: Secondary | ICD-10-CM | POA: Diagnosis not present

## 2021-09-07 DIAGNOSIS — Z20822 Contact with and (suspected) exposure to covid-19: Secondary | ICD-10-CM | POA: Diagnosis not present

## 2021-09-07 DIAGNOSIS — R059 Cough, unspecified: Secondary | ICD-10-CM | POA: Diagnosis not present

## 2021-09-07 DIAGNOSIS — G243 Spasmodic torticollis: Secondary | ICD-10-CM | POA: Diagnosis not present

## 2021-09-07 DIAGNOSIS — R051 Acute cough: Secondary | ICD-10-CM | POA: Diagnosis not present

## 2021-10-23 DIAGNOSIS — Z299 Encounter for prophylactic measures, unspecified: Secondary | ICD-10-CM | POA: Diagnosis not present

## 2021-10-23 DIAGNOSIS — K5909 Other constipation: Secondary | ICD-10-CM | POA: Diagnosis not present

## 2021-10-23 DIAGNOSIS — K219 Gastro-esophageal reflux disease without esophagitis: Secondary | ICD-10-CM | POA: Diagnosis not present

## 2021-10-23 DIAGNOSIS — R32 Unspecified urinary incontinence: Secondary | ICD-10-CM | POA: Diagnosis not present

## 2021-10-23 DIAGNOSIS — R350 Frequency of micturition: Secondary | ICD-10-CM | POA: Diagnosis not present

## 2021-10-23 DIAGNOSIS — I10 Essential (primary) hypertension: Secondary | ICD-10-CM | POA: Diagnosis not present

## 2021-10-23 DIAGNOSIS — Z789 Other specified health status: Secondary | ICD-10-CM | POA: Diagnosis not present

## 2021-10-23 DIAGNOSIS — N3010 Interstitial cystitis (chronic) without hematuria: Secondary | ICD-10-CM | POA: Diagnosis not present

## 2021-11-13 DIAGNOSIS — M810 Age-related osteoporosis without current pathological fracture: Secondary | ICD-10-CM | POA: Diagnosis not present

## 2021-12-05 DIAGNOSIS — G500 Trigeminal neuralgia: Secondary | ICD-10-CM | POA: Diagnosis not present

## 2021-12-05 DIAGNOSIS — H18519 Endothelial corneal dystrophy, unspecified eye: Secondary | ICD-10-CM | POA: Diagnosis not present

## 2021-12-05 DIAGNOSIS — H401211 Low-tension glaucoma, right eye, mild stage: Secondary | ICD-10-CM | POA: Diagnosis not present

## 2021-12-05 DIAGNOSIS — H43813 Vitreous degeneration, bilateral: Secondary | ICD-10-CM | POA: Diagnosis not present

## 2021-12-21 DIAGNOSIS — Z1231 Encounter for screening mammogram for malignant neoplasm of breast: Secondary | ICD-10-CM | POA: Diagnosis not present

## 2022-01-04 DIAGNOSIS — G243 Spasmodic torticollis: Secondary | ICD-10-CM | POA: Diagnosis not present

## 2022-02-15 DIAGNOSIS — K638219 Small intestinal bacterial overgrowth, unspecified: Secondary | ICD-10-CM | POA: Diagnosis not present

## 2022-02-15 DIAGNOSIS — R12 Heartburn: Secondary | ICD-10-CM | POA: Diagnosis not present

## 2022-02-15 DIAGNOSIS — R142 Eructation: Secondary | ICD-10-CM | POA: Diagnosis not present

## 2022-02-15 DIAGNOSIS — R140 Abdominal distension (gaseous): Secondary | ICD-10-CM | POA: Diagnosis not present

## 2022-04-25 DIAGNOSIS — M65332 Trigger finger, left middle finger: Secondary | ICD-10-CM | POA: Diagnosis not present

## 2022-04-26 DIAGNOSIS — G243 Spasmodic torticollis: Secondary | ICD-10-CM | POA: Diagnosis not present

## 2022-05-03 DIAGNOSIS — R1084 Generalized abdominal pain: Secondary | ICD-10-CM | POA: Diagnosis not present

## 2022-05-03 DIAGNOSIS — R1031 Right lower quadrant pain: Secondary | ICD-10-CM | POA: Diagnosis not present

## 2022-05-03 DIAGNOSIS — R350 Frequency of micturition: Secondary | ICD-10-CM | POA: Diagnosis not present

## 2022-05-03 DIAGNOSIS — N3010 Interstitial cystitis (chronic) without hematuria: Secondary | ICD-10-CM | POA: Diagnosis not present

## 2022-05-03 DIAGNOSIS — N3941 Urge incontinence: Secondary | ICD-10-CM | POA: Diagnosis not present

## 2022-05-03 DIAGNOSIS — R109 Unspecified abdominal pain: Secondary | ICD-10-CM | POA: Diagnosis not present

## 2022-05-11 DIAGNOSIS — I10 Essential (primary) hypertension: Secondary | ICD-10-CM | POA: Diagnosis not present

## 2022-05-11 DIAGNOSIS — D692 Other nonthrombocytopenic purpura: Secondary | ICD-10-CM | POA: Diagnosis not present

## 2022-05-11 DIAGNOSIS — M810 Age-related osteoporosis without current pathological fracture: Secondary | ICD-10-CM | POA: Diagnosis not present

## 2022-05-11 DIAGNOSIS — M542 Cervicalgia: Secondary | ICD-10-CM | POA: Diagnosis not present

## 2022-05-11 DIAGNOSIS — Z299 Encounter for prophylactic measures, unspecified: Secondary | ICD-10-CM | POA: Diagnosis not present

## 2022-05-11 DIAGNOSIS — K219 Gastro-esophageal reflux disease without esophagitis: Secondary | ICD-10-CM | POA: Diagnosis not present

## 2022-05-17 DIAGNOSIS — N281 Cyst of kidney, acquired: Secondary | ICD-10-CM | POA: Diagnosis not present

## 2022-05-17 DIAGNOSIS — K5730 Diverticulosis of large intestine without perforation or abscess without bleeding: Secondary | ICD-10-CM | POA: Diagnosis not present

## 2022-05-17 DIAGNOSIS — R109 Unspecified abdominal pain: Secondary | ICD-10-CM | POA: Diagnosis not present

## 2022-05-18 DIAGNOSIS — R109 Unspecified abdominal pain: Secondary | ICD-10-CM | POA: Diagnosis not present

## 2022-05-22 DIAGNOSIS — Z7189 Other specified counseling: Secondary | ICD-10-CM | POA: Diagnosis not present

## 2022-05-22 DIAGNOSIS — Z79899 Other long term (current) drug therapy: Secondary | ICD-10-CM | POA: Diagnosis not present

## 2022-05-22 DIAGNOSIS — Z0000 Encounter for general adult medical examination without abnormal findings: Secondary | ICD-10-CM | POA: Diagnosis not present

## 2022-05-22 DIAGNOSIS — Z6824 Body mass index (BMI) 24.0-24.9, adult: Secondary | ICD-10-CM | POA: Diagnosis not present

## 2022-05-22 DIAGNOSIS — Z299 Encounter for prophylactic measures, unspecified: Secondary | ICD-10-CM | POA: Diagnosis not present

## 2022-05-22 DIAGNOSIS — M810 Age-related osteoporosis without current pathological fracture: Secondary | ICD-10-CM | POA: Diagnosis not present

## 2022-05-22 DIAGNOSIS — E7800 Pure hypercholesterolemia, unspecified: Secondary | ICD-10-CM | POA: Diagnosis not present

## 2022-05-22 DIAGNOSIS — Z1339 Encounter for screening examination for other mental health and behavioral disorders: Secondary | ICD-10-CM | POA: Diagnosis not present

## 2022-05-22 DIAGNOSIS — I10 Essential (primary) hypertension: Secondary | ICD-10-CM | POA: Diagnosis not present

## 2022-05-22 DIAGNOSIS — Z87891 Personal history of nicotine dependence: Secondary | ICD-10-CM | POA: Diagnosis not present

## 2022-05-22 DIAGNOSIS — R5383 Other fatigue: Secondary | ICD-10-CM | POA: Diagnosis not present

## 2022-05-22 DIAGNOSIS — Z1331 Encounter for screening for depression: Secondary | ICD-10-CM | POA: Diagnosis not present

## 2022-05-24 DIAGNOSIS — N3010 Interstitial cystitis (chronic) without hematuria: Secondary | ICD-10-CM | POA: Diagnosis not present

## 2022-05-24 DIAGNOSIS — R109 Unspecified abdominal pain: Secondary | ICD-10-CM | POA: Diagnosis not present

## 2022-06-06 DIAGNOSIS — M810 Age-related osteoporosis without current pathological fracture: Secondary | ICD-10-CM | POA: Diagnosis not present

## 2022-06-28 DIAGNOSIS — N3010 Interstitial cystitis (chronic) without hematuria: Secondary | ICD-10-CM | POA: Diagnosis not present

## 2022-07-11 DIAGNOSIS — G500 Trigeminal neuralgia: Secondary | ICD-10-CM | POA: Diagnosis not present

## 2022-07-11 DIAGNOSIS — H2513 Age-related nuclear cataract, bilateral: Secondary | ICD-10-CM | POA: Diagnosis not present

## 2022-07-11 DIAGNOSIS — H43813 Vitreous degeneration, bilateral: Secondary | ICD-10-CM | POA: Diagnosis not present

## 2022-07-11 DIAGNOSIS — H401211 Low-tension glaucoma, right eye, mild stage: Secondary | ICD-10-CM | POA: Diagnosis not present

## 2022-07-11 DIAGNOSIS — H18513 Endothelial corneal dystrophy, bilateral: Secondary | ICD-10-CM | POA: Diagnosis not present

## 2022-08-20 DIAGNOSIS — H18513 Endothelial corneal dystrophy, bilateral: Secondary | ICD-10-CM | POA: Diagnosis not present

## 2022-08-20 DIAGNOSIS — H40023 Open angle with borderline findings, high risk, bilateral: Secondary | ICD-10-CM | POA: Diagnosis not present

## 2022-08-20 DIAGNOSIS — H25813 Combined forms of age-related cataract, bilateral: Secondary | ICD-10-CM | POA: Diagnosis not present

## 2022-08-24 DIAGNOSIS — Z299 Encounter for prophylactic measures, unspecified: Secondary | ICD-10-CM | POA: Diagnosis not present

## 2022-08-24 DIAGNOSIS — G253 Myoclonus: Secondary | ICD-10-CM | POA: Diagnosis not present

## 2022-08-24 DIAGNOSIS — M542 Cervicalgia: Secondary | ICD-10-CM | POA: Diagnosis not present

## 2022-08-24 DIAGNOSIS — I10 Essential (primary) hypertension: Secondary | ICD-10-CM | POA: Diagnosis not present

## 2022-08-24 DIAGNOSIS — J329 Chronic sinusitis, unspecified: Secondary | ICD-10-CM | POA: Diagnosis not present

## 2022-08-29 DIAGNOSIS — R0981 Nasal congestion: Secondary | ICD-10-CM | POA: Diagnosis not present

## 2022-08-29 DIAGNOSIS — I10 Essential (primary) hypertension: Secondary | ICD-10-CM | POA: Diagnosis not present

## 2022-08-29 DIAGNOSIS — J329 Chronic sinusitis, unspecified: Secondary | ICD-10-CM | POA: Diagnosis not present

## 2022-08-29 DIAGNOSIS — Z299 Encounter for prophylactic measures, unspecified: Secondary | ICD-10-CM | POA: Diagnosis not present

## 2022-09-28 DIAGNOSIS — N3010 Interstitial cystitis (chronic) without hematuria: Secondary | ICD-10-CM | POA: Diagnosis not present

## 2022-09-28 DIAGNOSIS — R350 Frequency of micturition: Secondary | ICD-10-CM | POA: Diagnosis not present

## 2022-09-28 DIAGNOSIS — N3941 Urge incontinence: Secondary | ICD-10-CM | POA: Diagnosis not present

## 2022-10-23 DIAGNOSIS — Z881 Allergy status to other antibiotic agents status: Secondary | ICD-10-CM | POA: Diagnosis not present

## 2022-10-23 DIAGNOSIS — Z91011 Allergy to milk products: Secondary | ICD-10-CM | POA: Diagnosis not present

## 2022-10-23 DIAGNOSIS — Z87891 Personal history of nicotine dependence: Secondary | ICD-10-CM | POA: Diagnosis not present

## 2022-10-23 DIAGNOSIS — H2511 Age-related nuclear cataract, right eye: Secondary | ICD-10-CM | POA: Diagnosis not present

## 2022-10-23 DIAGNOSIS — H18511 Endothelial corneal dystrophy, right eye: Secondary | ICD-10-CM | POA: Diagnosis not present

## 2022-12-11 DIAGNOSIS — Z882 Allergy status to sulfonamides status: Secondary | ICD-10-CM | POA: Diagnosis not present

## 2022-12-11 DIAGNOSIS — H2512 Age-related nuclear cataract, left eye: Secondary | ICD-10-CM | POA: Diagnosis not present

## 2022-12-11 DIAGNOSIS — Z91011 Allergy to milk products: Secondary | ICD-10-CM | POA: Diagnosis not present

## 2022-12-11 DIAGNOSIS — Z87891 Personal history of nicotine dependence: Secondary | ICD-10-CM | POA: Diagnosis not present

## 2022-12-11 DIAGNOSIS — Z79899 Other long term (current) drug therapy: Secondary | ICD-10-CM | POA: Diagnosis not present

## 2022-12-11 DIAGNOSIS — H18512 Endothelial corneal dystrophy, left eye: Secondary | ICD-10-CM | POA: Diagnosis not present

## 2022-12-26 DIAGNOSIS — M810 Age-related osteoporosis without current pathological fracture: Secondary | ICD-10-CM | POA: Diagnosis not present

## 2023-01-14 DIAGNOSIS — I10 Essential (primary) hypertension: Secondary | ICD-10-CM | POA: Diagnosis not present

## 2023-01-14 DIAGNOSIS — Z299 Encounter for prophylactic measures, unspecified: Secondary | ICD-10-CM | POA: Diagnosis not present

## 2023-01-14 DIAGNOSIS — L309 Dermatitis, unspecified: Secondary | ICD-10-CM | POA: Diagnosis not present

## 2023-01-14 DIAGNOSIS — G243 Spasmodic torticollis: Secondary | ICD-10-CM | POA: Diagnosis not present

## 2023-02-25 DIAGNOSIS — Z961 Presence of intraocular lens: Secondary | ICD-10-CM | POA: Diagnosis not present

## 2023-02-25 DIAGNOSIS — Z947 Corneal transplant status: Secondary | ICD-10-CM | POA: Diagnosis not present

## 2023-02-25 DIAGNOSIS — H40023 Open angle with borderline findings, high risk, bilateral: Secondary | ICD-10-CM | POA: Diagnosis not present

## 2023-02-27 DIAGNOSIS — G243 Spasmodic torticollis: Secondary | ICD-10-CM | POA: Diagnosis not present

## 2023-03-07 DIAGNOSIS — Z299 Encounter for prophylactic measures, unspecified: Secondary | ICD-10-CM | POA: Diagnosis not present

## 2023-03-07 DIAGNOSIS — J029 Acute pharyngitis, unspecified: Secondary | ICD-10-CM | POA: Diagnosis not present

## 2023-03-07 DIAGNOSIS — J020 Streptococcal pharyngitis: Secondary | ICD-10-CM | POA: Diagnosis not present

## 2023-03-25 DIAGNOSIS — Z299 Encounter for prophylactic measures, unspecified: Secondary | ICD-10-CM | POA: Diagnosis not present

## 2023-03-25 DIAGNOSIS — R0981 Nasal congestion: Secondary | ICD-10-CM | POA: Diagnosis not present

## 2023-03-25 DIAGNOSIS — J069 Acute upper respiratory infection, unspecified: Secondary | ICD-10-CM | POA: Diagnosis not present

## 2023-04-01 DIAGNOSIS — Z299 Encounter for prophylactic measures, unspecified: Secondary | ICD-10-CM | POA: Diagnosis not present

## 2023-04-01 DIAGNOSIS — R059 Cough, unspecified: Secondary | ICD-10-CM | POA: Diagnosis not present

## 2023-04-01 DIAGNOSIS — R0981 Nasal congestion: Secondary | ICD-10-CM | POA: Diagnosis not present

## 2023-04-01 DIAGNOSIS — J069 Acute upper respiratory infection, unspecified: Secondary | ICD-10-CM | POA: Diagnosis not present

## 2023-04-04 DIAGNOSIS — J069 Acute upper respiratory infection, unspecified: Secondary | ICD-10-CM | POA: Diagnosis not present

## 2023-04-04 DIAGNOSIS — U071 COVID-19: Secondary | ICD-10-CM | POA: Diagnosis not present

## 2023-04-04 DIAGNOSIS — Z299 Encounter for prophylactic measures, unspecified: Secondary | ICD-10-CM | POA: Diagnosis not present

## 2023-04-18 DIAGNOSIS — Z1231 Encounter for screening mammogram for malignant neoplasm of breast: Secondary | ICD-10-CM | POA: Diagnosis not present

## 2023-05-15 DIAGNOSIS — R350 Frequency of micturition: Secondary | ICD-10-CM | POA: Diagnosis not present

## 2023-05-15 DIAGNOSIS — N3010 Interstitial cystitis (chronic) without hematuria: Secondary | ICD-10-CM | POA: Diagnosis not present

## 2023-05-29 DIAGNOSIS — G243 Spasmodic torticollis: Secondary | ICD-10-CM | POA: Diagnosis not present

## 2023-05-31 DIAGNOSIS — G500 Trigeminal neuralgia: Secondary | ICD-10-CM | POA: Diagnosis not present

## 2023-06-21 DIAGNOSIS — G500 Trigeminal neuralgia: Secondary | ICD-10-CM | POA: Diagnosis not present

## 2023-07-08 DIAGNOSIS — Z961 Presence of intraocular lens: Secondary | ICD-10-CM | POA: Diagnosis not present

## 2023-07-08 DIAGNOSIS — G500 Trigeminal neuralgia: Secondary | ICD-10-CM | POA: Diagnosis not present

## 2023-07-08 DIAGNOSIS — Z947 Corneal transplant status: Secondary | ICD-10-CM | POA: Diagnosis not present

## 2023-07-08 DIAGNOSIS — H40023 Open angle with borderline findings, high risk, bilateral: Secondary | ICD-10-CM | POA: Diagnosis not present

## 2023-07-08 DIAGNOSIS — H18513 Endothelial corneal dystrophy, bilateral: Secondary | ICD-10-CM | POA: Diagnosis not present

## 2023-07-10 DIAGNOSIS — G500 Trigeminal neuralgia: Secondary | ICD-10-CM | POA: Diagnosis not present

## 2023-07-15 DIAGNOSIS — M810 Age-related osteoporosis without current pathological fracture: Secondary | ICD-10-CM | POA: Diagnosis not present

## 2023-07-16 DIAGNOSIS — J069 Acute upper respiratory infection, unspecified: Secondary | ICD-10-CM | POA: Diagnosis not present

## 2023-07-23 DIAGNOSIS — R5383 Other fatigue: Secondary | ICD-10-CM | POA: Diagnosis not present

## 2023-07-23 DIAGNOSIS — Z79899 Other long term (current) drug therapy: Secondary | ICD-10-CM | POA: Diagnosis not present

## 2023-07-23 DIAGNOSIS — Z1339 Encounter for screening examination for other mental health and behavioral disorders: Secondary | ICD-10-CM | POA: Diagnosis not present

## 2023-07-23 DIAGNOSIS — Z7189 Other specified counseling: Secondary | ICD-10-CM | POA: Diagnosis not present

## 2023-07-23 DIAGNOSIS — Z0000 Encounter for general adult medical examination without abnormal findings: Secondary | ICD-10-CM | POA: Diagnosis not present

## 2023-07-23 DIAGNOSIS — R300 Dysuria: Secondary | ICD-10-CM | POA: Diagnosis not present

## 2023-07-23 DIAGNOSIS — Z87891 Personal history of nicotine dependence: Secondary | ICD-10-CM | POA: Diagnosis not present

## 2023-07-23 DIAGNOSIS — I10 Essential (primary) hypertension: Secondary | ICD-10-CM | POA: Diagnosis not present

## 2023-07-23 DIAGNOSIS — Z299 Encounter for prophylactic measures, unspecified: Secondary | ICD-10-CM | POA: Diagnosis not present

## 2023-07-23 DIAGNOSIS — E7800 Pure hypercholesterolemia, unspecified: Secondary | ICD-10-CM | POA: Diagnosis not present

## 2023-07-23 DIAGNOSIS — R52 Pain, unspecified: Secondary | ICD-10-CM | POA: Diagnosis not present

## 2023-07-23 DIAGNOSIS — Z1331 Encounter for screening for depression: Secondary | ICD-10-CM | POA: Diagnosis not present

## 2023-07-30 DIAGNOSIS — E875 Hyperkalemia: Secondary | ICD-10-CM | POA: Diagnosis not present

## 2023-08-06 DIAGNOSIS — M810 Age-related osteoporosis without current pathological fracture: Secondary | ICD-10-CM | POA: Diagnosis not present

## 2023-08-07 DIAGNOSIS — G243 Spasmodic torticollis: Secondary | ICD-10-CM | POA: Diagnosis not present

## 2023-08-13 DIAGNOSIS — N3010 Interstitial cystitis (chronic) without hematuria: Secondary | ICD-10-CM | POA: Diagnosis not present

## 2023-08-28 DIAGNOSIS — G500 Trigeminal neuralgia: Secondary | ICD-10-CM | POA: Diagnosis not present

## 2023-10-29 DIAGNOSIS — M7541 Impingement syndrome of right shoulder: Secondary | ICD-10-CM | POA: Diagnosis not present

## 2023-11-06 DIAGNOSIS — G243 Spasmodic torticollis: Secondary | ICD-10-CM | POA: Diagnosis not present

## 2023-11-07 ENCOUNTER — Encounter: Payer: Self-pay | Admitting: Physical Therapy

## 2023-11-07 ENCOUNTER — Ambulatory Visit: Attending: Orthopedic Surgery | Admitting: Physical Therapy

## 2023-11-07 ENCOUNTER — Other Ambulatory Visit: Payer: Self-pay

## 2023-11-07 DIAGNOSIS — M25511 Pain in right shoulder: Secondary | ICD-10-CM | POA: Diagnosis not present

## 2023-11-07 NOTE — Therapy (Signed)
 OUTPATIENT PHYSICAL THERAPY SHOULDER EVALUATION   Patient Name: [name] MRN: [id number] DOB:1951/02/15, 73 y.o., female Today's Date: 11/07/2023  END OF SESSION:  PT End of Session - 11/07/23 1208     Visit Number 1    Number of Visits 12    Date for PT Re-Evaluation 12/19/23    PT Start Time 0941    PT Stop Time 1035    PT Time Calculation (min) 54 min    Activity Tolerance Patient tolerated treatment well    Behavior During Therapy WFL for tasks assessed/performed          Past Medical History:  Diagnosis Date   Anxiety    hx panic attacks;  no meds   Arthritis NECK AND SHOULDERS   Chronic facial pain SECONDARY TO NERVE PALSY--  TAKES GABAPENTIN    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   Frequency of urination    GERD (gastroesophageal reflux disease)    H/O hiatal hernia    History of kidney stones    Interstitial cystitis    Nocturia    PONV (postoperative nausea and vomiting)    Urgency of urination    Past Surgical History:  Procedure Laterality Date   ABDOMINAL HYSTERECTOMY  1987   AND BURCH PROCEDURE  (BLADDER SUSPENSION)   ATTEMPTED URETEROSCOPIC STONE EXTRACTION  2003   BLADDER SLING PROCEDURE  2005   BREAST REDUCTION SURGERY  2009   BILATERAL   BUNIONECTOMY  SEPT 2005   LEFT FOOT   EXTRACORPOREAL SHOCK WAVE LITHOTRIPSY  2002   RIGHT SIDE   LAPAROSCOPIC LYSIS OF ADHESIONS  03/03/2012   Procedure: LAPAROSCOPIC LYSIS OF ADHESIONS;  Surgeon: Alm JAYSON Cook, MD;  Location: [facility] [place];  Service: Gynecology;  Laterality: N/A;   LAPAROSCOPY  03/03/2012   Procedure: LAPAROSCOPY OPERATIVE;  Surgeon: Alm JAYSON Cook, MD;  Location: [facility];  Service: Gynecology;  Laterality: N/A;   NASAL SINUS SURGERY  DEC 2009   PUBOVAGINAL SLING  04/05/2011   Procedure: CARLOYN GLADE;  Surgeon: Arlena LILLETTE Gal, MD;  Location: [facility];  Service: Urology;  Laterality: N/A;  ANTERIOR UPHOLD LITE   AND  CYSTOSCOPY   RECTOCELE REPAIR N/A 08/18/2012   Procedure: REPAIR POSTERIOR VAGINAL WALL SCAR AND VAGINOPLASTY ;  Surgeon: Arlena LILLETTE Gal, MD;  Location: [facility];  Service: Urology;  Laterality: N/A;   RIGHT SHOULDER SURGERY  DEC 2005   TUBAL LIGATION  1980   VAGINAL PROLAPSE REPAIR  03/03/2012   Procedure: VAGINAL VAULT SUSPENSION;  Surgeon: Arlena LILLETTE Gal, MD;  Location: [facility];  Service: Urology;  Laterality: N/A;  repair with xenform with ileococcygeus repair   Patient Active Problem List   Diagnosis Date Noted   SOB (shortness of breath) 11/04/2014   Constipation 01/08/2011    REFERRING PROVIDER: Elspeth Her MD  REFERRING DIAG: Impingement syndrome of right shoulder.  THERAPY DIAG:  Acute pain of right shoulder  Rationale for Evaluation and Treatment: Rehabilitation  ONSET DATE: ~2 months ago.  SUBJECTIVE:  SUBJECTIVE STATEMENT: The patient presents to the clinic with c/o right shoulder pain over the last two months due to vacuuming.  Her pain-level today is a 5-6/10 and described as throbbing and sharp.  Lifting her right arm and lifting objects increases her pain.  She received an injection last week which was helpful.    PERTINENT HISTORY: Cervical dystonia, OP.  Patient states she sees a Chiropractor once a week for her neck and mid-back.  Facial nerve palsy.  PAIN:  Are you having pain? Yes: NPRS scale: 5-6/10. Pain location: Right shoulder. Pain description: As above. Aggravating factors: As above.   Relieving factors: As above.    PRECAUTIONS: Other: OP.  RED FLAGS: None   WEIGHT BEARING RESTRICTIONS: No  FALLS:  Has patient fallen in last 6 months? No  LIVING ENVIRONMENT: Lives in: House/apartment Has following equipment at  home: None  OCCUPATION: Retired.    PLOF: Independent  PATIENT GOALS:Use right shoulder without pain.    NEXT MD VISIT:   OBJECTIVE:  PATIENT SURVEYS:  Quick DASH:  31.82  POSTURE: Forward head, rounded shoulder, cervical rotation and tilt due to dystonia.    UPPER EXTREMITY ROM:   Essentially full right shoulder flexion (patient's daughter who was present states that prior to her injection she struggled much more with this movement).  Full ER and behind back motion.    UPPER EXTREMITY MMT:  IR/ER with elbow by side is a solid 4+/5.  Flexion and abduction is 4+/5.  SHOULDER SPECIAL TESTS: Some pain reproduction with a right Speed's test and Impingement test.  Normal UE DTR's.  PALPATION:  Tender to palpation over right bicipital groove, anterior deltoid, acromial ridge and middle deltoid.  Her right UT is remarkable for increased tone and a trigger point.                                                                                                                               TREATMENT DATE: 11/07/23: IFC at 80-150 Hz on 40% scan x 20 minutes to patient's right shoulder.    PATIENT EDUCATION: Education details:  Person educated:  International aid/development worker:  Education comprehension:   HOME EXERCISE PROGRAM:   ASSESSMENT:  CLINICAL IMPRESSION: The patient presents to OPPT with c/o right shoulder pain that came on about two months ago after vacuuming.  A recent injection was helpful and she now essentially has full right shoulder active range of motion.  She has mild pain reproduction with a Speed's and impingement test.  She is tender to palpation over her right bicipital groove, anterior deltoid, acromial ridge and middle deltoid.  Her right UT is remarkable for increased tone and a trigger point.  Her Quick DASH score is 31.82.   Patient will benefit from skilled physical therapy intervention to address pain and deficits.    OBJECTIVE IMPAIRMENTS: decreased activity  tolerance, decreased strength, increased muscle spasms, and pain.   ACTIVITY LIMITATIONS: carrying, lifting, and  reach over head  PARTICIPATION LIMITATIONS: meal prep, cleaning, and laundry  PERSONAL FACTORS: 1 comorbidity: cervical dystonia are also affecting patient's functional outcome.   REHAB POTENTIAL: Good  CLINICAL DECISION MAKING: Stable/uncomplicated  EVALUATION COMPLEXITY: Low   GOALS:  SHORT TERM GOALS: Target date: 11/21/23  Ind with a HEP. Goal status: INITIAL   LONG TERM GOALS: Target date: 12/19/23  Patient perform ADL's with right shoulder pain not > 2-3/10.  Goal status: INITIAL  2.  Improve right shoulder strength to 5/5.  Goal status: INITIAL  3.  Improve Quick DASH score by at least 15%.  Goal status: INITIAL   PLAN:  PT FREQUENCY: 2x/week  PT DURATION: 6 weeks  PLANNED INTERVENTIONS: 97110-Therapeutic exercises, 97530- Therapeutic activity, [id number]- Neuromuscular re-education, 97535- Self Care, 02859- Manual therapy, G0283- Electrical stimulation (unattended), 97035- Ultrasound, 79439 (1-2 muscles), 20561 (3+ muscles)- Dry Needling, Patient/Family education, Cryotherapy, and Moist heat  PLAN FOR NEXT SESSION: UBE, RW4, combo e'stim/US  and STW/M.     Kynadee Dam, [place], PT 11/07/2023, 12:38 PM

## 2023-11-12 ENCOUNTER — Encounter: Payer: Self-pay | Admitting: *Deleted

## 2023-11-12 ENCOUNTER — Ambulatory Visit: Admitting: *Deleted

## 2023-11-12 DIAGNOSIS — M25511 Pain in right shoulder: Secondary | ICD-10-CM

## 2023-11-12 NOTE — Therapy (Signed)
 OUTPATIENT PHYSICAL THERAPY SHOULDER TREATMENT   Patient Name: [name] MRN: [id number] DOB:04/03/1951, 73 y.o., female Today's Date: 11/12/2023  END OF SESSION:  PT End of Session - 11/12/23 1357     Visit Number 2    Number of Visits 12    Date for PT Re-Evaluation 12/19/23    PT Start Time 1345    PT Stop Time 1435    PT Time Calculation (min) 50 min          Past Medical History:  Diagnosis Date   Anxiety    hx panic attacks;  no meds   Arthritis NECK AND SHOULDERS   Chronic facial pain SECONDARY TO NERVE PALSY--  TAKES GABAPENTIN    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   Frequency of urination    GERD (gastroesophageal reflux disease)    H/O hiatal hernia    History of kidney stones    Interstitial cystitis    Nocturia    PONV (postoperative nausea and vomiting)    Urgency of urination    Past Surgical History:  Procedure Laterality Date   ABDOMINAL HYSTERECTOMY  1987   AND BURCH PROCEDURE  (BLADDER SUSPENSION)   ATTEMPTED URETEROSCOPIC STONE EXTRACTION  2003   BLADDER SLING PROCEDURE  2005   BREAST REDUCTION SURGERY  2009   BILATERAL   BUNIONECTOMY  SEPT 2005   LEFT FOOT   EXTRACORPOREAL SHOCK WAVE LITHOTRIPSY  2002   RIGHT SIDE   LAPAROSCOPIC LYSIS OF ADHESIONS  03/03/2012   Procedure: LAPAROSCOPIC LYSIS OF ADHESIONS;  Surgeon: Alm JAYSON Cook, MD;  Location: [facility] [place];  Service: Gynecology;  Laterality: N/A;   LAPAROSCOPY  03/03/2012   Procedure: LAPAROSCOPY OPERATIVE;  Surgeon: Alm JAYSON Cook, MD;  Location: [facility];  Service: Gynecology;  Laterality: N/A;   NASAL SINUS SURGERY  DEC 2009   PUBOVAGINAL SLING  04/05/2011   Procedure: CARLOYN GLADE;  Surgeon: Arlena LILLETTE Gal, MD;  Location: [facility];  Service: Urology;  Laterality: N/A;  ANTERIOR UPHOLD LITE   AND CYSTOSCOPY   RECTOCELE REPAIR N/A 08/18/2012   Procedure: REPAIR POSTERIOR VAGINAL WALL SCAR AND VAGINOPLASTY ;  Surgeon:  Arlena LILLETTE Gal, MD;  Location: [facility] [place];  Service: Urology;  Laterality: N/A;   RIGHT SHOULDER SURGERY  DEC 2005   TUBAL LIGATION  1980   VAGINAL PROLAPSE REPAIR  03/03/2012   Procedure: VAGINAL VAULT SUSPENSION;  Surgeon: Arlena LILLETTE Gal, MD;  Location: [facility];  Service: Urology;  Laterality: N/A;  repair with xenform with ileococcygeus repair   Patient Active Problem List   Diagnosis Date Noted   SOB (shortness of breath) 11/04/2014   Constipation 01/08/2011    REFERRING PROVIDER: Elspeth Her MD  REFERRING DIAG: Impingement syndrome of right shoulder.  THERAPY DIAG:  Acute pain of right shoulder  Rationale for Evaluation and Treatment: Rehabilitation  ONSET DATE: ~2 months ago.  SUBJECTIVE:  SUBJECTIVE STATEMENT: The patient presents to the clinic with c/o right shoulder pain over the last two months due to vacuuming. 3/10    PERTINENT HISTORY: Cervical dystonia, OP.  Patient states she sees a Chiropractor once a week for her neck and mid-back.  Facial nerve palsy.  PAIN:  Are you having pain? Yes: NPRS scale: 3/10. Pain location: Right shoulder. Pain description: As above. Aggravating factors: As above.   Relieving factors: As above.    PRECAUTIONS: Other: OP.  RED FLAGS: None   WEIGHT BEARING RESTRICTIONS: No  FALLS:  Has patient fallen in last 6 months? No  LIVING ENVIRONMENT: Lives in: House/apartment Has following equipment at home: None  OCCUPATION: Retired.    PLOF: Independent  PATIENT GOALS:Use right shoulder without pain.    NEXT MD VISIT:   OBJECTIVE:  PATIENT SURVEYS:  Quick DASH:  31.82  POSTURE: Forward head, rounded shoulder, cervical rotation and tilt due to dystonia.    UPPER EXTREMITY ROM:    Essentially full right shoulder flexion (patient's daughter who was present states that prior to her injection she struggled much more with this movement).  Full ER and behind back motion.    UPPER EXTREMITY MMT:  IR/ER with elbow by side is a solid 4+/5.  Flexion and abduction is 4+/5.  SHOULDER SPECIAL TESTS: Some pain reproduction with a right Speed's test and Impingement test.  Normal UE DTR's.  PALPATION:  Tender to palpation over right bicipital groove, anterior deltoid, acromial ridge and middle deltoid.  Her right UT is remarkable for increased tone and a trigger point.                                                                                                                               TREATMENT DATE: 11/12/23:                                     EXERCISE LOG       RT shldr  Exercise Repetitions and Resistance Comments  Pulleys X 5 mins   UE Ranger X 5 mins   Standing flexion 1# 3x10 to 90 degrees   Shldr ext Yellow  2x10   Shldr IR/ER Yellow    2x10    Blank cell = exercise not performed today  Manual.  STW/TPR to RT Utrap and Levaror  IFC at 80-150 Hz on 40% scan x 15 minutes to patient's right shoulder.    PATIENT EDUCATION: Education details:  Person educated:  International aid/development worker:  Education comprehension:   HOME EXERCISE PROGRAM:   ASSESSMENT:  CLINICAL IMPRESSION: The patient presents to OPPT with c/o right shoulder pain that came on about two months ago after vacuuming. Rx focused on AAROM, AROM, as well as light strengthening and did well without any increased pain. IFC end of session.      OBJECTIVE IMPAIRMENTS: decreased activity  tolerance, decreased strength, increased muscle spasms, and pain.   ACTIVITY LIMITATIONS: carrying, lifting, and reach over head  PARTICIPATION LIMITATIONS: meal prep, cleaning, and laundry  PERSONAL FACTORS: 1 comorbidity: cervical dystonia are also affecting patient's functional outcome.   REHAB POTENTIAL:  Good  CLINICAL DECISION MAKING: Stable/uncomplicated  EVALUATION COMPLEXITY: Low   GOALS:  SHORT TERM GOALS: Target date: 11/21/23  Ind with a HEP. Goal status: INITIAL   LONG TERM GOALS: Target date: 12/19/23  Patient perform ADL's with right shoulder pain not > 2-3/10.  Goal status: INITIAL  2.  Improve right shoulder strength to 5/5.  Goal status: INITIAL  3.  Improve Quick DASH score by at least 15%.  Goal status: INITIAL   PLAN:  PT FREQUENCY: 2x/week  PT DURATION: 6 weeks  PLANNED INTERVENTIONS: 97110-Therapeutic exercises, 97530- Therapeutic activity, [id number]- Neuromuscular re-education, 97535- Self Care, 02859- Manual therapy, G0283- Electrical stimulation (unattended), 97035- Ultrasound, 79439 (1-2 muscles), 20561 (3+ muscles)- Dry Needling, Patient/Family education, Cryotherapy, and Moist heat  PLAN FOR NEXT SESSION: UBE, RW4, combo e'stim/US  and STW/M.     Cloria Ciresi,CHRIS, PTA 11/12/2023, 4:16 PM

## 2023-11-15 ENCOUNTER — Ambulatory Visit: Admitting: Physical Therapy

## 2023-11-15 DIAGNOSIS — M25511 Pain in right shoulder: Secondary | ICD-10-CM | POA: Diagnosis not present

## 2023-11-15 NOTE — Therapy (Signed)
 OUTPATIENT PHYSICAL THERAPY SHOULDER TREATMENT   Patient Name: [name] MRN: [id number] DOB:01/02/1951, 73 y.o., female Today's Date: 11/15/2023  END OF SESSION:  PT End of Session - 11/15/23 1028     Visit Number 3    Number of Visits 12    Date for PT Re-Evaluation 12/19/23    PT Start Time 0930    PT Stop Time 1022    PT Time Calculation (min) 52 min    Activity Tolerance Patient tolerated treatment well    Behavior During Therapy WFL for tasks assessed/performed           Past Medical History:  Diagnosis Date   Anxiety    hx panic attacks;  no meds   Arthritis NECK AND SHOULDERS   Chronic facial pain SECONDARY TO NERVE PALSY--  TAKES GABAPENTIN    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   Frequency of urination    GERD (gastroesophageal reflux disease)    H/O hiatal hernia    History of kidney stones    Interstitial cystitis    Nocturia    PONV (postoperative nausea and vomiting)    Urgency of urination    Past Surgical History:  Procedure Laterality Date   ABDOMINAL HYSTERECTOMY  1987   AND BURCH PROCEDURE  (BLADDER SUSPENSION)   ATTEMPTED URETEROSCOPIC STONE EXTRACTION  2003   BLADDER SLING PROCEDURE  2005   BREAST REDUCTION SURGERY  2009   BILATERAL   BUNIONECTOMY  SEPT 2005   LEFT FOOT   EXTRACORPOREAL SHOCK WAVE LITHOTRIPSY  2002   RIGHT SIDE   LAPAROSCOPIC LYSIS OF ADHESIONS  03/03/2012   Procedure: LAPAROSCOPIC LYSIS OF ADHESIONS;  Surgeon: Alm JAYSON Cook, MD;  Location: [facility] [place];  Service: Gynecology;  Laterality: N/A;   LAPAROSCOPY  03/03/2012   Procedure: LAPAROSCOPY OPERATIVE;  Surgeon: Alm JAYSON Cook, MD;  Location: [facility];  Service: Gynecology;  Laterality: N/A;   NASAL SINUS SURGERY  DEC 2009   PUBOVAGINAL SLING  04/05/2011   Procedure: CARLOYN GLADE;  Surgeon: Arlena LILLETTE Gal, MD;  Location: [facility];  Service: Urology;  Laterality: N/A;  ANTERIOR UPHOLD LITE   AND  CYSTOSCOPY   RECTOCELE REPAIR N/A 08/18/2012   Procedure: REPAIR POSTERIOR VAGINAL WALL SCAR AND VAGINOPLASTY ;  Surgeon: Arlena LILLETTE Gal, MD;  Location: [facility] [place];  Service: Urology;  Laterality: N/A;   RIGHT SHOULDER SURGERY  DEC 2005   TUBAL LIGATION  1980   VAGINAL PROLAPSE REPAIR  03/03/2012   Procedure: VAGINAL VAULT SUSPENSION;  Surgeon: Arlena LILLETTE Gal, MD;  Location: [facility];  Service: Urology;  Laterality: N/A;  repair with xenform with ileococcygeus repair   Patient Active Problem List   Diagnosis Date Noted   SOB (shortness of breath) 11/04/2014   Constipation 01/08/2011    REFERRING PROVIDER: Elspeth Her MD  REFERRING DIAG: Impingement syndrome of right shoulder.  THERAPY DIAG:  Acute pain of right shoulder  Rationale for Evaluation and Treatment: Rehabilitation  ONSET DATE: ~2 months ago.  SUBJECTIVE:  SUBJECTIVE STATEMENT: No new complaints.  PERTINENT HISTORY: Cervical dystonia, OP.  Patient states she sees a Chiropractor once a week for her neck and mid-back.  Facial nerve palsy.  PAIN:  Are you having pain? Yes: NPRS scale: 3/10. Pain location: Right shoulder. Pain description: As above. Aggravating factors: As above.   Relieving factors: As above.    PRECAUTIONS: Other: OP.  RED FLAGS: None   WEIGHT BEARING RESTRICTIONS: No  FALLS:  Has patient fallen in last 6 months? No  LIVING ENVIRONMENT: Lives in: House/apartment Has following equipment at home: None  OCCUPATION: Retired.    PLOF: Independent  PATIENT GOALS:Use right shoulder without pain.    NEXT MD VISIT:   OBJECTIVE:  PATIENT SURVEYS:  Quick DASH:  31.82  POSTURE: Forward head, rounded shoulder, cervical rotation and tilt due to dystonia.    UPPER  EXTREMITY ROM:   Essentially full right shoulder flexion (patient's daughter who was present states that prior to her injection she struggled much more with this movement).  Full ER and behind back motion.    UPPER EXTREMITY MMT:  IR/ER with elbow by side is a solid 4+/5.  Flexion and abduction is 4+/5.  SHOULDER SPECIAL TESTS: Some pain reproduction with a right Speed's test and Impingement test.  Normal UE DTR's.  PALPATION:  Tender to palpation over right bicipital groove, anterior deltoid, acromial ridge and middle deltoid.  Her right UT is remarkable for increased tone and a trigger point.                                                                                                                               TREATMENT DATE:   11/15/23:                                     EXERCISE LOG  Exercise Repetitions and Resistance Comments  Pulleys 6 minutes   UBE 10 minutes   RW4 Yellow theraband to fatigue           STW/M x 5 minutes to right UT f/b  IFC at 80-150 Hz on 40% scan x 20 minutes.  Normal modality response following removal of modality.    11/12/23:                                     EXERCISE LOG       RT shldr  Exercise Repetitions and Resistance Comments  Pulleys X 5 mins   UE Ranger X 5 mins   Standing flexion 1# 3x10 to 90 degrees   Shldr ext Yellow  2x10   Shldr IR/ER Yellow    2x10    Blank cell = exercise not performed today  Manual.  STW/TPR to RT Utrap and Levaror  IFC at 80-150 Hz on 40%  scan x 15 minutes to patient's right shoulder.    PATIENT EDUCATION: Education details: See below. Person educated: Patient. Education method: demo Education comprehension: Handout  HOME EXERCISE PROGRAM: RW4  ASSESSMENT:  CLINICAL IMPRESSION: Patient did well with the addition of the RW4.  Yellow theraband provided for HEP.   OBJECTIVE IMPAIRMENTS: decreased activity tolerance, decreased strength, increased muscle spasms, and pain.   ACTIVITY  LIMITATIONS: carrying, lifting, and reach over head  PARTICIPATION LIMITATIONS: meal prep, cleaning, and laundry  PERSONAL FACTORS: 1 comorbidity: cervical dystonia are also affecting patient's functional outcome.   REHAB POTENTIAL: Good  CLINICAL DECISION MAKING: Stable/uncomplicated  EVALUATION COMPLEXITY: Low   GOALS:  SHORT TERM GOALS: Target date: 11/21/23  Ind with a HEP. Goal status: INITIAL   LONG TERM GOALS: Target date: 12/19/23  Patient perform ADL's with right shoulder pain not > 2-3/10.  Goal status: INITIAL  2.  Improve right shoulder strength to 5/5.  Goal status: INITIAL  3.  Improve Quick DASH score by at least 15%.  Goal status: INITIAL   PLAN:  PT FREQUENCY: 2x/week  PT DURATION: 6 weeks  PLANNED INTERVENTIONS: 97110-Therapeutic exercises, 97530- Therapeutic activity, [id number]- Neuromuscular re-education, 97535- Self Care, 02859- Manual therapy, G0283- Electrical stimulation (unattended), 97035- Ultrasound, 79439 (1-2 muscles), 20561 (3+ muscles)- Dry Needling, Patient/Family education, Cryotherapy, and Moist heat  PLAN FOR NEXT SESSION: UBE, RW4, combo e'stim/US  and STW/M.     Sui Kasparek, [place], PT 11/15/2023, 11:14 AM

## 2023-11-19 ENCOUNTER — Ambulatory Visit: Admitting: Physical Therapy

## 2023-11-19 DIAGNOSIS — M25511 Pain in right shoulder: Secondary | ICD-10-CM

## 2023-11-19 NOTE — Therapy (Addendum)
 OUTPATIENT PHYSICAL THERAPY SHOULDER TREATMENT   Patient Name: [name] MRN: [id number] DOB:Sep 18, 1950, 73 y.o., female Today's Date: 11/19/2023  END OF SESSION:  PT End of Session - 11/19/23 0932     Visit Number 4    Number of Visits 12    Date for PT Re-Evaluation 12/19/23    PT Start Time 0932    PT Stop Time 1024    PT Time Calculation (min) 52 min    Activity Tolerance Patient tolerated treatment well    Behavior During Therapy WFL for tasks assessed/performed           Past Medical History:  Diagnosis Date   Anxiety    hx panic attacks;  no meds   Arthritis NECK AND SHOULDERS   Chronic facial pain SECONDARY TO NERVE PALSY--  TAKES GABAPENTIN    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   Frequency of urination    GERD (gastroesophageal reflux disease)    H/O hiatal hernia    History of kidney stones    Interstitial cystitis    Nocturia    PONV (postoperative nausea and vomiting)    Urgency of urination    Past Surgical History:  Procedure Laterality Date   ABDOMINAL HYSTERECTOMY  1987   AND BURCH PROCEDURE  (BLADDER SUSPENSION)   ATTEMPTED URETEROSCOPIC STONE EXTRACTION  2003   BLADDER SLING PROCEDURE  2005   BREAST REDUCTION SURGERY  2009   BILATERAL   BUNIONECTOMY  SEPT 2005   LEFT FOOT   EXTRACORPOREAL SHOCK WAVE LITHOTRIPSY  2002   RIGHT SIDE   LAPAROSCOPIC LYSIS OF ADHESIONS  03/03/2012   Procedure: LAPAROSCOPIC LYSIS OF ADHESIONS;  Surgeon: Alm JAYSON Cook, MD;  Location: [facility];  Service: Gynecology;  Laterality: N/A;   LAPAROSCOPY  03/03/2012   Procedure: LAPAROSCOPY OPERATIVE;  Surgeon: Alm JAYSON Cook, MD;  Location: [facility];  Service: Gynecology;  Laterality: N/A;   NASAL SINUS SURGERY  DEC 2009   PUBOVAGINAL SLING  04/05/2011   Procedure: CARLOYN GLADE;  Surgeon: Arlena LILLETTE Gal, MD;  Location: [facility];  Service: Urology;  Laterality: N/A;  ANTERIOR UPHOLD LITE   AND  CYSTOSCOPY   RECTOCELE REPAIR N/A 08/18/2012   Procedure: REPAIR POSTERIOR VAGINAL WALL SCAR AND VAGINOPLASTY ;  Surgeon: Arlena LILLETTE Gal, MD;  Location: [facility];  Service: Urology;  Laterality: N/A;   RIGHT SHOULDER SURGERY  DEC 2005   TUBAL LIGATION  1980   VAGINAL PROLAPSE REPAIR  03/03/2012   Procedure: VAGINAL VAULT SUSPENSION;  Surgeon: Arlena LILLETTE Gal, MD;  Location: [facility];  Service: Urology;  Laterality: N/A;  repair with xenform with ileococcygeus repair   Patient Active Problem List   Diagnosis Date Noted   SOB (shortness of breath) 11/04/2014   Constipation 01/08/2011    REFERRING PROVIDER: Elspeth Her MD  REFERRING DIAG: Impingement syndrome of right shoulder.  THERAPY DIAG:  Acute pain of right shoulder  Rationale for Evaluation and Treatment: Rehabilitation  ONSET DATE: ~2 months ago.  SUBJECTIVE:  SUBJECTIVE STATEMENT: Therapy is helping.  PERTINENT HISTORY: Cervical dystonia, OP.  Patient states she sees a Chiropractor once a week for her neck and mid-back.  Facial nerve palsy.  PAIN:  Are you having pain? Yes: NPRS scale: low/10. Pain location: Right shoulder. Pain description: As above. Aggravating factors: As above.   Relieving factors: As above.    PRECAUTIONS: Other: OP.  RED FLAGS: None   WEIGHT BEARING RESTRICTIONS: No  FALLS:  Has patient fallen in last 6 months? No  LIVING ENVIRONMENT: Lives in: House/apartment Has following equipment at home: None  OCCUPATION: Retired.    PLOF: Independent  PATIENT GOALS:Use right shoulder without pain.    NEXT MD VISIT:   OBJECTIVE:  PATIENT SURVEYS:  Quick DASH:  31.82  POSTURE: Forward head, rounded shoulder, cervical rotation and tilt due to dystonia.     UPPER EXTREMITY ROM:   Essentially full right shoulder flexion (patient's daughter who was present states that prior to her injection she struggled much more with this movement).  Full ER and behind back motion.    UPPER EXTREMITY MMT:  IR/ER with elbow by side is a solid 4+/5.  Flexion and abduction is 4+/5.  SHOULDER SPECIAL TESTS: Some pain reproduction with a right Speed's test and Impingement test.  Normal UE DTR's.  PALPATION:  Tender to palpation over right bicipital groove, anterior deltoid, acromial ridge and middle deltoid.  Her right UT is remarkable for increased tone and a trigger point.                                                                                                                               TREATMENT DATE:   11/19/23:  Pulleys x 5 minutes f/b UBE x 10 minutes f/b STW/M to right UT and bicipital groove region x 10 minutes f/b IFC at 80-150 Hz on 40% scan x 20 minutes.  Normal modality response following removal of modality   11/15/23:                                     EXERCISE LOG  Exercise Repetitions and Resistance Comments  Pulleys 6 minutes   UBE 10 minutes   RW4 Yellow theraband to fatigue           STW/M x 5 minutes to right UT f/b  IFC at 80-150 Hz on 40% scan x 20 minutes.  Normal modality response following removal of modality.    11/12/23:                                     EXERCISE LOG       RT shldr  Exercise Repetitions and Resistance Comments  Pulleys X 5 mins   UE Ranger X 5 mins   Standing flexion 1# 3x10 to  90 degrees   Shldr ext Yellow  2x10   Shldr IR/ER Yellow    2x10    Blank cell = exercise not performed today  Manual.  STW/TPR to RT Utrap and Levaror  IFC at 80-150 Hz on 40% scan x 15 minutes to patient's right shoulder.    PATIENT EDUCATION: Education details: See below. Person educated: Patient. Education method: demo Education comprehension: Handout  HOME EXERCISE  PROGRAM: [email or web address]  ASSESSMENT:  CLINICAL IMPRESSION: Patient progressing well and reporting a low pain-level upon presentation to the clinic today and less pain with right UE movement.  OBJECTIVE IMPAIRMENTS: decreased activity tolerance, decreased strength, increased muscle spasms, and pain.   ACTIVITY LIMITATIONS: carrying, lifting, and reach over head  PARTICIPATION LIMITATIONS: meal prep, cleaning, and laundry  PERSONAL FACTORS: 1 comorbidity: cervical dystonia are also affecting patient's functional outcome.   REHAB POTENTIAL: Good  CLINICAL DECISION MAKING: Stable/uncomplicated  EVALUATION COMPLEXITY: Low   GOALS:  SHORT TERM GOALS: Target date: 11/21/23  Ind with a HEP. Goal status: INITIAL   LONG TERM GOALS: Target date: 12/19/23  Patient perform ADL's with right shoulder pain not > 2-3/10.  Goal status: INITIAL  2.  Improve right shoulder strength to 5/5.  Goal status: INITIAL  3.  Improve Quick DASH score by at least 15%.  Goal status: INITIAL   PLAN:  PT FREQUENCY: 2x/week  PT DURATION: 6 weeks  PLANNED INTERVENTIONS: 97110-Therapeutic exercises, 97530- Therapeutic activity, [id number]- Neuromuscular re-education, 97535- Self Care, [id number]- Manual therapy, [id number]- Electrical stimulation (unattended), 97035- Ultrasound, 79439 (1-2 muscles), 20561 (3+ muscles)- Dry Needling, Patient/Family education, Cryotherapy, and Moist heat  PLAN FOR NEXT SESSION: UBE, RW4, combo e'stim/US  and STW/M.     Leveta Wahab, [place], PT 11/19/2023, 10:39 AM

## 2023-11-21 ENCOUNTER — Ambulatory Visit

## 2023-11-21 DIAGNOSIS — M25511 Pain in right shoulder: Secondary | ICD-10-CM

## 2023-11-21 NOTE — Therapy (Signed)
 OUTPATIENT PHYSICAL THERAPY SHOULDER TREATMENT   Patient Name: [name] MRN: [id number] DOB:December 05, 1950, 73 y.o., female Today's Date: 11/21/2023  END OF SESSION:  PT End of Session - 11/21/23 0934     Visit Number 5    Number of Visits 12    Date for PT Re-Evaluation 12/19/23    PT Start Time 0930    PT Stop Time 1033    PT Time Calculation (min) 63 min    Activity Tolerance Patient tolerated treatment well    Behavior During Therapy WFL for tasks assessed/performed           Past Medical History:  Diagnosis Date   Anxiety    hx panic attacks;  no meds   Arthritis NECK AND SHOULDERS   Chronic facial pain SECONDARY TO NERVE PALSY--  TAKES GABAPENTIN    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   Frequency of urination    GERD (gastroesophageal reflux disease)    H/O hiatal hernia    History of kidney stones    Interstitial cystitis    Nocturia    PONV (postoperative nausea and vomiting)    Urgency of urination    Past Surgical History:  Procedure Laterality Date   ABDOMINAL HYSTERECTOMY  1987   AND BURCH PROCEDURE  (BLADDER SUSPENSION)   ATTEMPTED URETEROSCOPIC STONE EXTRACTION  2003   BLADDER SLING PROCEDURE  2005   BREAST REDUCTION SURGERY  2009   BILATERAL   BUNIONECTOMY  SEPT 2005   LEFT FOOT   EXTRACORPOREAL SHOCK WAVE LITHOTRIPSY  2002   RIGHT SIDE   LAPAROSCOPIC LYSIS OF ADHESIONS  03/03/2012   Procedure: LAPAROSCOPIC LYSIS OF ADHESIONS;  Surgeon: Alm JAYSON Cook, MD;  Location: [facility];  Service: Gynecology;  Laterality: N/A;   LAPAROSCOPY  03/03/2012   Procedure: LAPAROSCOPY OPERATIVE;  Surgeon: Alm JAYSON Cook, MD;  Location: [facility];  Service: Gynecology;  Laterality: N/A;   NASAL SINUS SURGERY  DEC 2009   PUBOVAGINAL SLING  04/05/2011   Procedure: CARLOYN GLADE;  Surgeon: Arlena LILLETTE Gal, MD;  Location: [facility];  Service: Urology;  Laterality: N/A;  ANTERIOR UPHOLD LITE   AND  CYSTOSCOPY   RECTOCELE REPAIR N/A 08/18/2012   Procedure: REPAIR POSTERIOR VAGINAL WALL SCAR AND VAGINOPLASTY ;  Surgeon: Arlena LILLETTE Gal, MD;  Location: [facility];  Service: Urology;  Laterality: N/A;   RIGHT SHOULDER SURGERY  DEC 2005   TUBAL LIGATION  1980   VAGINAL PROLAPSE REPAIR  03/03/2012   Procedure: VAGINAL VAULT SUSPENSION;  Surgeon: Arlena LILLETTE Gal, MD;  Location: [facility];  Service: Urology;  Laterality: N/A;  repair with xenform with ileococcygeus repair   Patient Active Problem List   Diagnosis Date Noted   SOB (shortness of breath) 11/04/2014   Constipation 01/08/2011    REFERRING PROVIDER: Elspeth Her MD  REFERRING DIAG: Impingement syndrome of right shoulder.  THERAPY DIAG:  Acute pain of right shoulder  Rationale for Evaluation and Treatment: Rehabilitation  ONSET DATE: ~2 months ago.  SUBJECTIVE:  SUBJECTIVE STATEMENT: Pt reports 5/10 right shoulder pain today.   PERTINENT HISTORY: Cervical dystonia, OP.  Patient states she sees a Chiropractor once a week for her neck and mid-back.  Facial nerve palsy.  PAIN:  Are you having pain? Yes: NPRS scale: 5/10. Pain location: Right shoulder. Pain description: As above. Aggravating factors: As above.   Relieving factors: As above.    PRECAUTIONS: Other: OP.  RED FLAGS: None   WEIGHT BEARING RESTRICTIONS: No  FALLS:  Has patient fallen in last 6 months? No  LIVING ENVIRONMENT: Lives in: House/apartment Has following equipment at home: None  OCCUPATION: Retired.    PLOF: Independent  PATIENT GOALS:Use right shoulder without pain.    NEXT MD VISIT:   OBJECTIVE:  PATIENT SURVEYS:  Quick DASH:  31.82  POSTURE: Forward head, rounded shoulder, cervical rotation and tilt due  to dystonia.    UPPER EXTREMITY ROM:   Essentially full right shoulder flexion (patient's daughter who was present states that prior to her injection she struggled much more with this movement).  Full ER and behind back motion.    UPPER EXTREMITY MMT:  IR/ER with elbow by side is a solid 4+/5.  Flexion and abduction is 4+/5.  SHOULDER SPECIAL TESTS: Some pain reproduction with a right Speed's test and Impingement test.  Normal UE DTR's.  PALPATION:  Tender to palpation over right bicipital groove, anterior deltoid, acromial ridge and middle deltoid.  Her right UT is remarkable for increased tone and a trigger point.                                                                                                                               TREATMENT DATE:   11/21/23                                EXERCISE LOG  Exercise Repetitions and Resistance Comments  UBE 10 mins 90 RPM (forward/backward)   Pulleys 5 mins   Ranger 4 mins   RW4 Yellow to fatigue        Blank cell = exercise not performed today   Manual Therapy Soft Tissue Mobilization: right upper trap, STW/M to right upper trap to decrease pain and tone   Modalities  Date:  Unattended Estim: Cervical, IFC 80-150 Hz, 20 mins, Pain and Tone                                      11/19/23:  Pulleys x 5 minutes f/b UBE x 10 minutes f/b STW/M to right UT and bicipital groove region x 10 minutes f/b IFC at 80-150 Hz on 40% scan x 20 minutes.  Normal modality response following removal of modality  11/15/23:  EXERCISE LOG  Exercise Repetitions and Resistance Comments  Pulleys 6 minutes   UBE 10 minutes   RW4 Yellow theraband to fatigue           STW/M x 5 minutes to right UT f/b  IFC at 80-150 Hz on 40% scan x 20 minutes.  Normal modality response following removal of modality.   PATIENT EDUCATION: Education details: See below. Person educated: Patient. Education method:  demo Education comprehension: Handout  HOME EXERCISE PROGRAM: RW4  ASSESSMENT:  CLINICAL IMPRESSION: Pt arrives for today's treatment session reporting 5/10 right neck and shoulder pain.  Reviewed previously performed exercises and HEP today with min cues for proper technique required.  STW/M performed to right upper trap and cervical paraspinals to decrease pain and tone.  Normal responses to estim noted upon removal.  Pt reported decreased pain at completion of today's treatment session.   OBJECTIVE IMPAIRMENTS: decreased activity tolerance, decreased strength, increased muscle spasms, and pain.   ACTIVITY LIMITATIONS: carrying, lifting, and reach over head  PARTICIPATION LIMITATIONS: meal prep, cleaning, and laundry  PERSONAL FACTORS: 1 comorbidity: cervical dystonia are also affecting patient's functional outcome.   REHAB POTENTIAL: Good  CLINICAL DECISION MAKING: Stable/uncomplicated  EVALUATION COMPLEXITY: Low   GOALS:  SHORT TERM GOALS: Target date: 11/21/23  Ind with a HEP. Goal status: MET   LONG TERM GOALS: Target date: 12/19/23  Patient perform ADL's with right shoulder pain not > 2-3/10.  Goal status: IN PROGRESS  2.  Improve right shoulder strength to 5/5.  Goal status: IN PROGRESS  3.  Improve Quick DASH score by at least 15%.  Goal status: IN PROGRESS   PLAN:  PT FREQUENCY: 2x/week  PT DURATION: 6 weeks  PLANNED INTERVENTIONS: 97110-Therapeutic exercises, 97530- Therapeutic activity, [id number]- Neuromuscular re-education, 97535- Self Care, 02859- Manual therapy, [id number]- Electrical stimulation (unattended), 97035- Ultrasound, 79439 (1-2 muscles), 20561 (3+ muscles)- Dry Needling, Patient/Family education, Cryotherapy, and Moist heat  PLAN FOR NEXT SESSION: UBE, RW4, combo e'stim/US  and STW/M.     Delon DELENA Gosling, PTA 11/21/2023, 10:41 AM

## 2023-11-25 DIAGNOSIS — H40023 Open angle with borderline findings, high risk, bilateral: Secondary | ICD-10-CM | POA: Diagnosis not present

## 2023-11-25 DIAGNOSIS — D23121 Other benign neoplasm of skin of left upper eyelid, including canthus: Secondary | ICD-10-CM | POA: Diagnosis not present

## 2023-11-25 DIAGNOSIS — H26493 Other secondary cataract, bilateral: Secondary | ICD-10-CM | POA: Diagnosis not present

## 2023-11-25 DIAGNOSIS — Z947 Corneal transplant status: Secondary | ICD-10-CM | POA: Diagnosis not present

## 2023-11-26 ENCOUNTER — Encounter

## 2023-11-27 ENCOUNTER — Encounter

## 2023-11-27 DIAGNOSIS — N3010 Interstitial cystitis (chronic) without hematuria: Secondary | ICD-10-CM | POA: Diagnosis not present

## 2023-11-27 DIAGNOSIS — N3941 Urge incontinence: Secondary | ICD-10-CM | POA: Diagnosis not present

## 2023-11-28 ENCOUNTER — Encounter

## 2023-11-29 ENCOUNTER — Encounter: Payer: Self-pay | Admitting: Physical Therapy

## 2023-11-29 ENCOUNTER — Ambulatory Visit: Attending: Orthopedic Surgery | Admitting: Physical Therapy

## 2023-11-29 DIAGNOSIS — M62838 Other muscle spasm: Secondary | ICD-10-CM | POA: Diagnosis not present

## 2023-11-29 DIAGNOSIS — M25511 Pain in right shoulder: Secondary | ICD-10-CM | POA: Insufficient documentation

## 2023-11-29 NOTE — Therapy (Signed)
 OUTPATIENT PHYSICAL THERAPY SHOULDER TREATMENT   Patient Name: [name] MRN: [id number] DOB:Mar 06, 1951, 73 y.o., female Today's Date: 11/29/2023  END OF SESSION:  PT End of Session - 11/29/23 1014     Visit Number 6    Number of Visits 12    Date for PT Re-Evaluation 12/19/23    PT Start Time 0930    PT Stop Time 1028    PT Time Calculation (min) 58 min    Activity Tolerance Patient tolerated treatment well    Behavior During Therapy WFL for tasks assessed/performed           Past Medical History:  Diagnosis Date   Anxiety    hx panic attacks;  no meds   Arthritis NECK AND SHOULDERS   Chronic facial pain SECONDARY TO NERVE PALSY--  TAKES GABAPENTIN    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   Frequency of urination    GERD (gastroesophageal reflux disease)    H/O hiatal hernia    History of kidney stones    Interstitial cystitis    Nocturia    PONV (postoperative nausea and vomiting)    Urgency of urination    Past Surgical History:  Procedure Laterality Date   ABDOMINAL HYSTERECTOMY  1987   AND BURCH PROCEDURE  (BLADDER SUSPENSION)   ATTEMPTED URETEROSCOPIC STONE EXTRACTION  2003   BLADDER SLING PROCEDURE  2005   BREAST REDUCTION SURGERY  2009   BILATERAL   BUNIONECTOMY  SEPT 2005   LEFT FOOT   EXTRACORPOREAL SHOCK WAVE LITHOTRIPSY  2002   RIGHT SIDE   LAPAROSCOPIC LYSIS OF ADHESIONS  03/03/2012   Procedure: LAPAROSCOPIC LYSIS OF ADHESIONS;  Surgeon: Alm JAYSON Cook, MD;  Location: [facility];  Service: Gynecology;  Laterality: N/A;   LAPAROSCOPY  03/03/2012   Procedure: LAPAROSCOPY OPERATIVE;  Surgeon: Alm JAYSON Cook, MD;  Location: [facility];  Service: Gynecology;  Laterality: N/A;   NASAL SINUS SURGERY  DEC 2009   PUBOVAGINAL SLING  04/05/2011   Procedure: CARLOYN GLADE;  Surgeon: Arlena LILLETTE Gal, MD;  Location: [facility];  Service: Urology;  Laterality: N/A;  ANTERIOR UPHOLD LITE   AND  CYSTOSCOPY   RECTOCELE REPAIR N/A 08/18/2012   Procedure: REPAIR POSTERIOR VAGINAL WALL SCAR AND VAGINOPLASTY ;  Surgeon: Arlena LILLETTE Gal, MD;  Location: [facility];  Service: Urology;  Laterality: N/A;   RIGHT SHOULDER SURGERY  DEC 2005   TUBAL LIGATION  1980   VAGINAL PROLAPSE REPAIR  03/03/2012   Procedure: VAGINAL VAULT SUSPENSION;  Surgeon: Arlena LILLETTE Gal, MD;  Location: [facility];  Service: Urology;  Laterality: N/A;  repair with xenform with ileococcygeus repair   Patient Active Problem List   Diagnosis Date Noted   SOB (shortness of breath) 11/04/2014   Constipation 01/08/2011    REFERRING PROVIDER: Elspeth Her MD  REFERRING DIAG: Impingement syndrome of right shoulder.  THERAPY DIAG:  Acute pain of right shoulder  Rationale for Evaluation and Treatment: Rehabilitation  ONSET DATE: ~2 months ago.  SUBJECTIVE:  SUBJECTIVE STATEMENT: Pt reports 6/10 right shoulder pain today.   PERTINENT HISTORY: Cervical dystonia, OP.  Patient states she sees a Chiropractor once a week for her neck and mid-back.  Facial nerve palsy.  PAIN:  Are you having pain? Yes: NPRS scale: 6/10. Pain location: Right shoulder. Pain description: As above. Aggravating factors: As above.   Relieving factors: As above.    PRECAUTIONS: Other: OP.  RED FLAGS: None   WEIGHT BEARING RESTRICTIONS: No  FALLS:  Has patient fallen in last 6 months? No  LIVING ENVIRONMENT: Lives in: House/apartment Has following equipment at home: None  OCCUPATION: Retired.    PLOF: Independent  PATIENT GOALS:Use right shoulder without pain.    NEXT MD VISIT:   OBJECTIVE:  PATIENT SURVEYS:  Quick DASH:  31.82  POSTURE: Forward head, rounded shoulder, cervical rotation and tilt due  to dystonia.    UPPER EXTREMITY ROM:   Essentially full right shoulder flexion (patient's daughter who was present states that prior to her injection she struggled much more with this movement).  Full ER and behind back motion.    UPPER EXTREMITY MMT:  IR/ER with elbow by side is a solid 4+/5.  Flexion and abduction is 4+/5.  SHOULDER SPECIAL TESTS: Some pain reproduction with a right Speed's test and Impingement test.  Normal UE DTR's.  PALPATION:  Tender to palpation over right bicipital groove, anterior deltoid, acromial ridge and middle deltoid.  Her right UT is remarkable for increased tone and a trigger point.                                                                                                                               TREATMENT DATE:   11/29/23:                                     EXERCISE LOG  Exercise Repetitions and Resistance Comments  UBE 6 minutes   Pulleys 6 minutes   RW4 Red theraband to fatigue x 2   Bicep curls Red theraband to fatigue x 2   Tricep ext Red theraband to fatigue   Full can  1# to fatigue x 2   IFC at 80-150 Hz on 40% scan x 20 minutes f/b STW/M x 6 minutes.    11/21/23                                EXERCISE LOG  Exercise Repetitions and Resistance Comments  UBE 10 mins 90 RPM (forward/backward)   Pulleys 5 mins   Ranger 4 mins   RW4 Yellow to fatigue         Manual Therapy Soft Tissue Mobilization: right upper trap, STW/M to right upper trap to decrease pain and tone   Modalities  Date:  Unattended Estim: Cervical, IFC 80-150 Hz,  20 mins, Pain and Tone                                      11/19/23:  Pulleys x 5 minutes f/b UBE x 10 minutes f/b STW/M to right UT and bicipital groove region x 10 minutes f/b IFC at 80-150 Hz on 40% scan x 20 minutes.  Normal modality response following removal of modality  11/15/23:                                   EXERCISE LOG  Exercise Repetitions and Resistance Comments  Pulleys 6  minutes   UBE 10 minutes   RW4 Yellow theraband to fatigue           STW/M x 5 minutes to right UT f/b  IFC at 80-150 Hz on 40% scan x 20 minutes.  Normal modality response following removal of modality.   PATIENT EDUCATION: Education details: See below. Person educated: Patient. Education method: demo Education comprehension: Handout  HOME EXERCISE PROGRAM: RW4...red theraband provided.    ASSESSMENT:  CLINICAL IMPRESSION: Patient did very well today.  Added resisted bicep curls, triceps extension and full can with excellent technique and no complaints.    OBJECTIVE IMPAIRMENTS: decreased activity tolerance, decreased strength, increased muscle spasms, and pain.   ACTIVITY LIMITATIONS: carrying, lifting, and reach over head  PARTICIPATION LIMITATIONS: meal prep, cleaning, and laundry  PERSONAL FACTORS: 1 comorbidity: cervical dystonia are also affecting patient's functional outcome.   REHAB POTENTIAL: Good  CLINICAL DECISION MAKING: Stable/uncomplicated  EVALUATION COMPLEXITY: Low   GOALS:  SHORT TERM GOALS: Target date: 11/21/23  Ind with a HEP. Goal status: MET   LONG TERM GOALS: Target date: 12/19/23  Patient perform ADL's with right shoulder pain not > 2-3/10.  Goal status: IN PROGRESS  2.  Improve right shoulder strength to 5/5.  Goal status: IN PROGRESS  3.  Improve Quick DASH score by at least 15%.  Goal status: IN PROGRESS   PLAN:  PT FREQUENCY: 2x/week  PT DURATION: 6 weeks  PLANNED INTERVENTIONS: 97110-Therapeutic exercises, 97530- Therapeutic activity, [id number]- Neuromuscular re-education, 97535- Self Care, 02859- Manual therapy, G0283- Electrical stimulation (unattended), 97035- Ultrasound, 79439 (1-2 muscles), 20561 (3+ muscles)- Dry Needling, Patient/Family education, Cryotherapy, and Moist heat  PLAN FOR NEXT SESSION: UBE, RW4, combo e'stim/US  and STW/M.     Terralyn Matsumura, [place], PT 11/29/2023, 10:39 AM

## 2023-12-05 ENCOUNTER — Encounter: Payer: Self-pay | Admitting: *Deleted

## 2023-12-05 ENCOUNTER — Ambulatory Visit: Admitting: *Deleted

## 2023-12-05 DIAGNOSIS — M25511 Pain in right shoulder: Secondary | ICD-10-CM

## 2023-12-05 DIAGNOSIS — M62838 Other muscle spasm: Secondary | ICD-10-CM | POA: Diagnosis not present

## 2023-12-05 NOTE — Therapy (Signed)
 OUTPATIENT PHYSICAL THERAPY SHOULDER TREATMENT   Patient Name: [name] MRN: [id number] DOB:1950/08/17, 73 y.o., female Today's Date: 12/05/2023  END OF SESSION:  PT End of Session - 12/05/23 0925     Visit Number 7    Number of Visits 12    Date for PT Re-Evaluation 12/19/23    PT Start Time 0926    PT Stop Time 1016    PT Time Calculation (min) 50 min           Past Medical History:  Diagnosis Date   Anxiety    hx panic attacks;  no meds   Arthritis NECK AND SHOULDERS   Chronic facial pain SECONDARY TO NERVE PALSY--  TAKES GABAPENTIN    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   Frequency of urination    GERD (gastroesophageal reflux disease)    H/O hiatal hernia    History of kidney stones    Interstitial cystitis    Nocturia    PONV (postoperative nausea and vomiting)    Urgency of urination    Past Surgical History:  Procedure Laterality Date   ABDOMINAL HYSTERECTOMY  1987   AND BURCH PROCEDURE  (BLADDER SUSPENSION)   ATTEMPTED URETEROSCOPIC STONE EXTRACTION  2003   BLADDER SLING PROCEDURE  2005   BREAST REDUCTION SURGERY  2009   BILATERAL   BUNIONECTOMY  SEPT 2005   LEFT FOOT   EXTRACORPOREAL SHOCK WAVE LITHOTRIPSY  2002   RIGHT SIDE   LAPAROSCOPIC LYSIS OF ADHESIONS  03/03/2012   Procedure: LAPAROSCOPIC LYSIS OF ADHESIONS;  Surgeon: Alm JAYSON Cook, MD;  Location: [facility];  Service: Gynecology;  Laterality: N/A;   LAPAROSCOPY  03/03/2012   Procedure: LAPAROSCOPY OPERATIVE;  Surgeon: Alm JAYSON Cook, MD;  Location: [facility];  Service: Gynecology;  Laterality: N/A;   NASAL SINUS SURGERY  DEC 2009   PUBOVAGINAL SLING  04/05/2011   Procedure: CARLOYN GLADE;  Surgeon: Arlena LILLETTE Gal, MD;  Location: [facility];  Service: Urology;  Laterality: N/A;  ANTERIOR UPHOLD LITE   AND CYSTOSCOPY   RECTOCELE REPAIR N/A 08/18/2012   Procedure: REPAIR POSTERIOR VAGINAL WALL SCAR AND VAGINOPLASTY ;  Surgeon:  Arlena LILLETTE Gal, MD;  Location: [facility];  Service: Urology;  Laterality: N/A;   RIGHT SHOULDER SURGERY  DEC 2005   TUBAL LIGATION  1980   VAGINAL PROLAPSE REPAIR  03/03/2012   Procedure: VAGINAL VAULT SUSPENSION;  Surgeon: Arlena LILLETTE Gal, MD;  Location: [facility];  Service: Urology;  Laterality: N/A;  repair with xenform with ileococcygeus repair   Patient Active Problem List   Diagnosis Date Noted   SOB (shortness of breath) 11/04/2014   Constipation 01/08/2011    REFERRING PROVIDER: Elspeth Her MD  REFERRING DIAG: Impingement syndrome of right shoulder.  THERAPY DIAG:  Acute pain of right shoulder  Rationale for Evaluation and Treatment: Rehabilitation  ONSET DATE: ~2 months ago.  SUBJECTIVE:  SUBJECTIVE STATEMENT: Pt reports 5/10 right shoulder pain today. No f/u with MD  PERTINENT HISTORY: Cervical dystonia, OP.  Patient states she sees a Chiropractor once a week for her neck and mid-back.  Facial nerve palsy.  PAIN:  Are you having pain? Yes: NPRS scale: 6/10. Pain location: Right shoulder. Pain description: As above. Aggravating factors: As above.   Relieving factors: As above.    PRECAUTIONS: Other: OP.  RED FLAGS: None   WEIGHT BEARING RESTRICTIONS: No  FALLS:  Has patient fallen in last 6 months? No  LIVING ENVIRONMENT: Lives in: House/apartment Has following equipment at home: None  OCCUPATION: Retired.    PLOF: Independent  PATIENT GOALS:Use right shoulder without pain.    NEXT MD VISIT:   OBJECTIVE:  PATIENT SURVEYS:  Quick DASH:  31.82  POSTURE: Forward head, rounded shoulder, cervical rotation and tilt due to dystonia.    UPPER EXTREMITY ROM:   Essentially full right shoulder flexion (patient's daughter who  was present states that prior to her injection she struggled much more with this movement).  Full ER and behind back motion.    UPPER EXTREMITY MMT:  IR/ER with elbow by side is a solid 4+/5.  Flexion and abduction is 4+/5.  SHOULDER SPECIAL TESTS: Some pain reproduction with a right Speed's test and Impingement test.  Normal UE DTR's.  PALPATION:  Tender to palpation over right bicipital groove, anterior deltoid, acromial ridge and middle deltoid.  Her right UT is remarkable for increased tone and a trigger point.                                                                                                                               TREATMENT DATE:   12/05/23:                                     EXERCISE LOG   RT shldr  Exercise Repetitions and Resistance Comments  UBE 6 minutes  120 RPMs   Pulleys 6 minutes   RW4 and extension Red theraband to fatigue x 2each way   Bicep curls    Tricep ext    Full can     IFC at 80-150 Hz on 40% scan x 15 minutes   Reviewed HEP     11/21/23                                EXERCISE LOG  Exercise Repetitions and Resistance Comments  UBE 10 mins 90 RPM (forward/backward)   Pulleys 5 mins   Ranger 4 mins   RW4 Yellow to fatigue         Manual Therapy Soft Tissue Mobilization: right upper trap, STW/M to right upper trap to decrease pain and tone   Modalities  Date:  Unattended Estim: Cervical, IFC 80-150  Hz, 20 mins, Pain and Tone                                      11/19/23:  Pulleys x 5 minutes f/b UBE x 10 minutes f/b STW/M to right UT and bicipital groove region x 10 minutes f/b IFC at 80-150 Hz on 40% scan x 20 minutes.  Normal modality response following removal of modality  11/15/23:                                   EXERCISE LOG  Exercise Repetitions and Resistance Comments  Pulleys 6 minutes   UBE 10 minutes   RW4 Yellow theraband to fatigue           STW/M x 5 minutes to right UT f/b  IFC at 80-150 Hz on 40%  scan x 20 minutes.  Normal modality response following removal of modality.   PATIENT EDUCATION: Education details: See below. Person educated: Patient. Education method: demo Education comprehension: Handout  HOME EXERCISE PROGRAM: RW4...red theraband provided.    ASSESSMENT:  CLINICAL IMPRESSION: Patient arrived today doing fairly well RT shldr 5/10 pain. She was able to continue with RT UE AROM, AAROM as well as light strengthening as well as review HEP.  Pt did well today with reports of decreased pain end of session.   OBJECTIVE IMPAIRMENTS: decreased activity tolerance, decreased strength, increased muscle spasms, and pain.   ACTIVITY LIMITATIONS: carrying, lifting, and reach over head  PARTICIPATION LIMITATIONS: meal prep, cleaning, and laundry  PERSONAL FACTORS: 1 comorbidity: cervical dystonia are also affecting patient's functional outcome.   REHAB POTENTIAL: Good  CLINICAL DECISION MAKING: Stable/uncomplicated  EVALUATION COMPLEXITY: Low   GOALS:  SHORT TERM GOALS: Target date: 11/21/23  Ind with a HEP. Goal status: MET   LONG TERM GOALS: Target date: 12/19/23  Patient perform ADL's with right shoulder pain not > 2-3/10.  Goal status: IN PROGRESS  2.  Improve right shoulder strength to 5/5.  Goal status: IN PROGRESS  3.  Improve Quick DASH score by at least 15%.  Goal status: IN PROGRESS   PLAN:  PT FREQUENCY: 2x/week  PT DURATION: 6 weeks  PLANNED INTERVENTIONS: 97110-Therapeutic exercises, 97530- Therapeutic activity, [id number]- Neuromuscular re-education, 97535- Self Care, 02859- Manual therapy, G0283- Electrical stimulation (unattended), 97035- Ultrasound, 79439 (1-2 muscles), 20561 (3+ muscles)- Dry Needling, Patient/Family education, Cryotherapy, and Moist heat  PLAN FOR NEXT SESSION: UBE, RW4, combo e'stim/US  and STW/M.     Berneda Piccininni,CHRIS, PTA 12/05/2023, 12:58 PM

## 2023-12-06 ENCOUNTER — Ambulatory Visit

## 2023-12-06 DIAGNOSIS — M25511 Pain in right shoulder: Secondary | ICD-10-CM | POA: Diagnosis not present

## 2023-12-06 DIAGNOSIS — M62838 Other muscle spasm: Secondary | ICD-10-CM | POA: Diagnosis not present

## 2023-12-06 NOTE — Therapy (Signed)
 OUTPATIENT PHYSICAL THERAPY SHOULDER TREATMENT   Patient Name: [name] MRN: [id number] DOB:[birth year], 73 y.o., female Today's Date: 12/06/2023  END OF SESSION:  PT End of Session - 12/06/23 0936     Visit Number 8    Number of Visits 12    Date for PT Re-Evaluation 12/19/23    PT Start Time 0930    PT Stop Time 1031    PT Time Calculation (min) 61 min    Activity Tolerance Patient tolerated treatment well    Behavior During Therapy WFL for tasks assessed/performed            Past Medical History:  Diagnosis Date   Anxiety    hx panic attacks;  no meds   Arthritis NECK AND SHOULDERS   Chronic facial pain SECONDARY TO NERVE PALSY--  TAKES GABAPENTIN    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   Frequency of urination    GERD (gastroesophageal reflux disease)    H/O hiatal hernia    History of kidney stones    Interstitial cystitis    Nocturia    PONV (postoperative nausea and vomiting)    Urgency of urination    Past Surgical History:  Procedure Laterality Date   ABDOMINAL HYSTERECTOMY  1987   AND BURCH PROCEDURE  (BLADDER SUSPENSION)   ATTEMPTED URETEROSCOPIC STONE EXTRACTION  2003   BLADDER SLING PROCEDURE  2005   BREAST REDUCTION SURGERY  2009   BILATERAL   BUNIONECTOMY  SEPT 2005   LEFT FOOT   EXTRACORPOREAL SHOCK WAVE LITHOTRIPSY  2002   RIGHT SIDE   LAPAROSCOPIC LYSIS OF ADHESIONS  03/03/2012   Procedure: LAPAROSCOPIC LYSIS OF ADHESIONS;  Surgeon: Alm JAYSON Cook, MD;  Location: [facility];  Service: Gynecology;  Laterality: N/A;   LAPAROSCOPY  03/03/2012   Procedure: LAPAROSCOPY OPERATIVE;  Surgeon: Alm JAYSON Cook, MD;  Location: [facility];  Service: Gynecology;  Laterality: N/A;   NASAL SINUS SURGERY  DEC 2009   PUBOVAGINAL SLING  04/05/2011   Procedure: CARLOYN GLADE;  Surgeon: Arlena LILLETTE Gal, MD;  Location: [facility];  Service: Urology;  Laterality: N/A;  ANTERIOR UPHOLD LITE   AND  CYSTOSCOPY   RECTOCELE REPAIR N/A 08/18/2012   Procedure: REPAIR POSTERIOR VAGINAL WALL SCAR AND VAGINOPLASTY ;  Surgeon: Arlena LILLETTE Gal, MD;  Location: [facility];  Service: Urology;  Laterality: N/A;   RIGHT SHOULDER SURGERY  DEC 2005   TUBAL LIGATION  1980   VAGINAL PROLAPSE REPAIR  03/03/2012   Procedure: VAGINAL VAULT SUSPENSION;  Surgeon: Arlena LILLETTE Gal, MD;  Location: [facility];  Service: Urology;  Laterality: N/A;  repair with xenform with ileococcygeus repair   Patient Active Problem List   Diagnosis Date Noted   SOB (shortness of breath) 11/04/2014   Constipation 01/08/2011    REFERRING PROVIDER: Elspeth Her MD  REFERRING DIAG: Impingement syndrome of right shoulder.  THERAPY DIAG:  Acute pain of right shoulder  Rationale for Evaluation and Treatment: Rehabilitation  ONSET DATE: ~2 months ago.  SUBJECTIVE:  SUBJECTIVE STATEMENT: Patient reports that she does not feel too bad right now, but she had pain reaching overhead this morning.   PERTINENT HISTORY: Cervical dystonia, OP.  Patient states she sees a Chiropractor once a week for her neck and mid-back.  Facial nerve palsy.  PAIN:  Are you having pain? Yes: NPRS scale: no pain score provided Pain location: Right shoulder. Pain description: As above. Aggravating factors: As above.   Relieving factors: As above.    PRECAUTIONS: Other: OP.  RED FLAGS: None   WEIGHT BEARING RESTRICTIONS: No  FALLS:  Has patient fallen in last 6 months? No  LIVING ENVIRONMENT: Lives in: House/apartment Has following equipment at home: None  OCCUPATION: Retired.    PLOF: Independent  PATIENT GOALS:Use right shoulder without pain.    NEXT MD VISIT:   OBJECTIVE:  PATIENT SURVEYS:  Quick DASH:   31.82  POSTURE: Forward head, rounded shoulder, cervical rotation and tilt due to dystonia.    UPPER EXTREMITY ROM:   Essentially full right shoulder flexion (patient's daughter who was present states that prior to her injection she struggled much more with this movement).  Full ER and behind back motion.    UPPER EXTREMITY MMT:  IR/ER with elbow by side is a solid 4+/5.  Flexion and abduction is 4+/5.  SHOULDER SPECIAL TESTS: Some pain reproduction with a right Speed's test and Impingement test.  Normal UE DTR's.  PALPATION:  Tender to palpation over right bicipital groove, anterior deltoid, acromial ridge and middle deltoid.  Her right UT is remarkable for increased tone and a trigger point.                                                                                                                               TREATMENT DATE:                                    12/06/23 EXERCISE LOG  Exercise Repetitions and Resistance Comments  UBE  8 minutes @ 120 RPM    Pulleys   2 minutes  Flexion  Supine cane flexion  20 reps   Supine cane horizontal ABD  20 reps  At 90 degrees shoulder flexion   Manual therapy See below    Resisted punch out  Red t-band x 2 x 15 reps    Resisted ER  Red t-band x 2 x 15 reps    Resisted row  Red r-band x 2 x 15 reps    Resisted deltoid fly  Yellow t-band x 2 x 15 reps    Resisted horizontal ABD  Yellow t-band x 2 x 15 reps     Blank cell = exercise not performed today  Manual Therapy Soft Tissue Mobilization: right biceps, for reduced pain and tone  Modalities: no redness or adverse reaction to today's modalities   Date:  Unattended Estim: right  upper trapezius, pre mod @ 80-150 Hz, 15 mins, Pain and Tone  12/05/23:  EXERCISE LOG   RT shldr  Exercise Repetitions and Resistance Comments  UBE 6 minutes  120 RPMs   Pulleys 6 minutes   RW4 and extension Red theraband to fatigue x 2each way   Bicep curls    Tricep ext    Full can     IFC at  80-150 Hz on 40% scan x 15 minutes   Reviewed HEP     11/21/23                                EXERCISE LOG  Exercise Repetitions and Resistance Comments  UBE 10 mins 90 RPM (forward/backward)   Pulleys 5 mins   Ranger 4 mins   RW4 Yellow to fatigue         Manual Therapy Soft Tissue Mobilization: right upper trap, STW/M to right upper trap to decrease pain and tone   Modalities  Date:  Unattended Estim: Cervical, IFC 80-150 Hz, 20 mins, Pain and Tone   PATIENT EDUCATION: Education details: See below. Person educated: Patient. Education method: demo Education comprehension: Handout  HOME EXERCISE PROGRAM: RW4...red theraband provided.    ASSESSMENT:  CLINICAL IMPRESSION: Patient was progressed with multiple new interventions for improved right shoulder strength and functional mobility. She required moderate multimodal cueing with today's supine interventions for proper exercise performance to facilitate proper muscular engagement. Manual therapy focused on soft tissue mobilization to her biceps for reduced soreness. This was followed by appropriately matched interventions to facilitate biceps and rotator cuff engagement. She experienced no significant increase in pain or discomfort with any of today's interventions. She reported feeling good upon the conclusion of treatment. She continues to require skilled physical therapy to address her remaining impairments to return to her prior level of function.    OBJECTIVE IMPAIRMENTS: decreased activity tolerance, decreased strength, increased muscle spasms, and pain.   ACTIVITY LIMITATIONS: carrying, lifting, and reach over head  PARTICIPATION LIMITATIONS: meal prep, cleaning, and laundry  PERSONAL FACTORS: 1 comorbidity: cervical dystonia are also affecting patient's functional outcome.   REHAB POTENTIAL: Good  CLINICAL DECISION MAKING: Stable/uncomplicated  EVALUATION COMPLEXITY: Low   GOALS:  SHORT TERM GOALS: Target  date: 11/21/23  Ind with a HEP. Goal status: MET   LONG TERM GOALS: Target date: 12/19/23  Patient perform ADL's with right shoulder pain not > 2-3/10.  Goal status: IN PROGRESS  2.  Improve right shoulder strength to 5/5.  Goal status: IN PROGRESS  3.  Improve Quick DASH score by at least 15%.  Goal status: IN PROGRESS   PLAN:  PT FREQUENCY: 2x/week  PT DURATION: 6 weeks  PLANNED INTERVENTIONS: 97110-Therapeutic exercises, 97530- Therapeutic activity, [id number]- Neuromuscular re-education, 97535- Self Care, 02859- Manual therapy, G0283- Electrical stimulation (unattended), 97035- Ultrasound, 79439 (1-2 muscles), 20561 (3+ muscles)- Dry Needling, Patient/Family education, Cryotherapy, and Moist heat  PLAN FOR NEXT SESSION: UBE, RW4, combo e'stim/US  and STW/M.     Lacinda JAYSON Fass, PT 12/06/2023, 12:03 PM

## 2023-12-11 DIAGNOSIS — G500 Trigeminal neuralgia: Secondary | ICD-10-CM | POA: Diagnosis not present

## 2023-12-13 ENCOUNTER — Ambulatory Visit: Admitting: Physical Therapy

## 2023-12-13 DIAGNOSIS — M62838 Other muscle spasm: Secondary | ICD-10-CM | POA: Diagnosis not present

## 2023-12-13 DIAGNOSIS — M25511 Pain in right shoulder: Secondary | ICD-10-CM | POA: Diagnosis not present

## 2023-12-13 NOTE — Therapy (Signed)
 OUTPATIENT PHYSICAL THERAPY SHOULDER TREATMENT   Patient Name: [name] MRN: [id number] DOB:06-28-1950, 73 y.o., female Today's Date: 12/13/2023  END OF SESSION:  PT End of Session - 12/13/23 0902     Visit Number 9    Number of Visits 12    Date for PT Re-Evaluation 12/19/23    PT Start Time 0801    PT Stop Time 0851    PT Time Calculation (min) 50 min    Activity Tolerance Patient tolerated treatment well    Behavior During Therapy WFL for tasks assessed/performed             Past Medical History:  Diagnosis Date   Anxiety    hx panic attacks;  no meds   Arthritis NECK AND SHOULDERS   Chronic facial pain SECONDARY TO NERVE PALSY--  TAKES GABAPENTIN    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   Frequency of urination    GERD (gastroesophageal reflux disease)    H/O hiatal hernia    History of kidney stones    Interstitial cystitis    Nocturia    PONV (postoperative nausea and vomiting)    Urgency of urination    Past Surgical History:  Procedure Laterality Date   ABDOMINAL HYSTERECTOMY  1987   AND BURCH PROCEDURE  (BLADDER SUSPENSION)   ATTEMPTED URETEROSCOPIC STONE EXTRACTION  2003   BLADDER SLING PROCEDURE  2005   BREAST REDUCTION SURGERY  2009   BILATERAL   BUNIONECTOMY  SEPT 2005   LEFT FOOT   EXTRACORPOREAL SHOCK WAVE LITHOTRIPSY  2002   RIGHT SIDE   LAPAROSCOPIC LYSIS OF ADHESIONS  03/03/2012   Procedure: LAPAROSCOPIC LYSIS OF ADHESIONS;  Surgeon: Alm JAYSON Cook, MD;  Location: [facility];  Service: Gynecology;  Laterality: N/A;   LAPAROSCOPY  03/03/2012   Procedure: LAPAROSCOPY OPERATIVE;  Surgeon: Alm JAYSON Cook, MD;  Location: [facility];  Service: Gynecology;  Laterality: N/A;   NASAL SINUS SURGERY  DEC 2009   PUBOVAGINAL SLING  04/05/2011   Procedure: CARLOYN GLADE;  Surgeon: Arlena LILLETTE Gal, MD;  Location: [facility];  Service: Urology;  Laterality: N/A;  ANTERIOR UPHOLD LITE   AND  CYSTOSCOPY   RECTOCELE REPAIR N/A 08/18/2012   Procedure: REPAIR POSTERIOR VAGINAL WALL SCAR AND VAGINOPLASTY ;  Surgeon: Arlena LILLETTE Gal, MD;  Location: [facility];  Service: Urology;  Laterality: N/A;   RIGHT SHOULDER SURGERY  DEC 2005   TUBAL LIGATION  1980   VAGINAL PROLAPSE REPAIR  03/03/2012   Procedure: VAGINAL VAULT SUSPENSION;  Surgeon: Arlena LILLETTE Gal, MD;  Location: [facility];  Service: Urology;  Laterality: N/A;  repair with xenform with ileococcygeus repair   Patient Active Problem List   Diagnosis Date Noted   SOB (shortness of breath) 11/04/2014   Constipation 01/08/2011    REFERRING PROVIDER: Elspeth Her MD  REFERRING DIAG: Impingement syndrome of right shoulder.  THERAPY DIAG:  Acute pain of right shoulder  Other muscle spasm  Rationale for Evaluation and Treatment: Rehabilitation  ONSET DATE: ~2 months ago.  SUBJECTIVE:  SUBJECTIVE STATEMENT: Pain at a 5.  PERTINENT HISTORY: Cervical dystonia, OP.  Patient states she sees a Chiropractor once a week for her neck and mid-back.  Facial nerve palsy.  PAIN:  Are you having pain? Yes: NPRS scale: 5/10 Pain location: Right shoulder. Pain description: As above. Aggravating factors: As above.   Relieving factors: As above.    PRECAUTIONS: Other: OP.  RED FLAGS: None   WEIGHT BEARING RESTRICTIONS: No  FALLS:  Has patient fallen in last 6 months? No  LIVING ENVIRONMENT: Lives in: House/apartment Has following equipment at home: None  OCCUPATION: Retired.    PLOF: Independent  PATIENT GOALS:Use right shoulder without pain.    NEXT MD VISIT:   OBJECTIVE:  PATIENT SURVEYS:  Quick DASH:  31.82  POSTURE: Forward head, rounded shoulder, cervical rotation and tilt due to  dystonia.    UPPER EXTREMITY ROM:   Essentially full right shoulder flexion (patient's daughter who was present states that prior to her injection she struggled much more with this movement).  Full ER and behind back motion.    UPPER EXTREMITY MMT:  IR/ER with elbow by side is a solid 4+/5.  Flexion and abduction is 4+/5.  SHOULDER SPECIAL TESTS: Some pain reproduction with a right Speed's test and Impingement test.  Normal UE DTR's.  PALPATION:  Tender to palpation over right bicipital groove, anterior deltoid, acromial ridge and middle deltoid.  Her right UT is remarkable for increased tone and a trigger point.                                                                                                                               TREATMENT DATE:   12/13/23:  UBE x 8 minutes f/b red theraband ER to fatigue x 2 f/b STW/M x 13 minutes to right UT/Supraspinatus and middle deltoid f/b IFC at 80-150 Hz on 40% scan x 20 minutes.  Normal modality response following removal of modality                                   12/06/23 EXERCISE LOG  Exercise Repetitions and Resistance Comments  UBE  8 minutes @ 120 RPM    Pulleys   2 minutes  Flexion  Supine cane flexion  20 reps   Supine cane horizontal ABD  20 reps  At 90 degrees shoulder flexion   Manual therapy See below    Resisted punch out  Red t-band x 2 x 15 reps    Resisted ER  Red t-band x 2 x 15 reps    Resisted row  Red r-band x 2 x 15 reps    Resisted deltoid fly  Yellow t-band x 2 x 15 reps    Resisted horizontal ABD  Yellow t-band x 2 x 15 reps     Blank cell = exercise not performed today  Manual Therapy Soft  Tissue Mobilization: right biceps, for reduced pain and tone  Modalities: no redness or adverse reaction to today's modalities   Date:  Unattended Estim: right upper trapezius, pre mod @ 80-150 Hz, 15 mins, Pain and Tone  12/05/23:  EXERCISE LOG   RT shldr  Exercise Repetitions and Resistance Comments  UBE 6  minutes  120 RPMs   Pulleys 6 minutes   RW4 and extension Red theraband to fatigue x 2each way   Bicep curls    Tricep ext    Full can     IFC at 80-150 Hz on 40% scan x 15 minutes   Reviewed HEP     11/21/23                                EXERCISE LOG  Exercise Repetitions and Resistance Comments  UBE 10 mins 90 RPM (forward/backward)   Pulleys 5 mins   Ranger 4 mins   RW4 Yellow to fatigue         Manual Therapy Soft Tissue Mobilization: right upper trap, STW/M to right upper trap to decrease pain and tone   Modalities  Date:  Unattended Estim: Cervical, IFC 80-150 Hz, 20 mins, Pain and Tone   PATIENT EDUCATION: Education details: See below. Person educated: Patient. Education method: demo Education comprehension: Handout  HOME EXERCISE PROGRAM: RW4...red theraband provided.    ASSESSMENT:  CLINICAL IMPRESSION: Patient did well with treatment.  She demonstrated excellent technique with ER.  She did well with soft tissue work.     OBJECTIVE IMPAIRMENTS: decreased activity tolerance, decreased strength, increased muscle spasms, and pain.   ACTIVITY LIMITATIONS: carrying, lifting, and reach over head  PARTICIPATION LIMITATIONS: meal prep, cleaning, and laundry  PERSONAL FACTORS: 1 comorbidity: cervical dystonia are also affecting patient's functional outcome.   REHAB POTENTIAL: Good  CLINICAL DECISION MAKING: Stable/uncomplicated  EVALUATION COMPLEXITY: Low   GOALS:  SHORT TERM GOALS: Target date: 11/21/23  Ind with a HEP. Goal status: MET   LONG TERM GOALS: Target date: 12/19/23  Patient perform ADL's with right shoulder pain not > 2-3/10.  Goal status: IN PROGRESS  2.  Improve right shoulder strength to 5/5.  Goal status: IN PROGRESS  3.  Improve Quick DASH score by at least 15%.  Goal status: IN PROGRESS   PLAN:  PT FREQUENCY: 2x/week  PT DURATION: 6 weeks  PLANNED INTERVENTIONS: 97110-Therapeutic exercises, 97530- Therapeutic  activity, [id number]- Neuromuscular re-education, 97535- Self Care, 02859- Manual therapy, G0283- Electrical stimulation (unattended), 97035- Ultrasound, 79439 (1-2 muscles), 20561 (3+ muscles)- Dry Needling, Patient/Family education, Cryotherapy, and Moist heat  PLAN FOR NEXT SESSION: UBE, RW4, combo e'stim/US  and STW/M.     Lachelle Rissler, [place], PT 12/13/2023, 9:06 AM

## 2023-12-17 ENCOUNTER — Ambulatory Visit

## 2023-12-17 DIAGNOSIS — M62838 Other muscle spasm: Secondary | ICD-10-CM

## 2023-12-17 DIAGNOSIS — M25511 Pain in right shoulder: Secondary | ICD-10-CM

## 2023-12-17 NOTE — Therapy (Addendum)
 OUTPATIENT PHYSICAL THERAPY SHOULDER TREATMENT   Patient Name: [name] MRN: [id number] DOB:02-15-1951, 73 y.o., female Today's Date: 12/17/2023  END OF SESSION:  PT End of Session - 12/17/23 0938     Visit Number 10    Number of Visits 12    Date for PT Re-Evaluation 12/19/23    PT Start Time 0932    Activity Tolerance Patient tolerated treatment well    Behavior During Therapy [facility] for tasks assessed/performed             Past Medical History:  Diagnosis Date   Anxiety    hx panic attacks;  no meds   Arthritis NECK AND SHOULDERS   Chronic facial pain SECONDARY TO NERVE PALSY--  TAKES GABAPENTIN    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   Frequency of urination    GERD (gastroesophageal reflux disease)    H/O hiatal hernia    History of kidney stones    Interstitial cystitis    Nocturia    PONV (postoperative nausea and vomiting)    Urgency of urination    Past Surgical History:  Procedure Laterality Date   ABDOMINAL HYSTERECTOMY  1987   AND BURCH PROCEDURE  (BLADDER SUSPENSION)   ATTEMPTED URETEROSCOPIC STONE EXTRACTION  2003   BLADDER SLING PROCEDURE  2005   BREAST REDUCTION SURGERY  2009   BILATERAL   BUNIONECTOMY  SEPT 2005   LEFT FOOT   EXTRACORPOREAL SHOCK WAVE LITHOTRIPSY  2002   RIGHT SIDE   LAPAROSCOPIC LYSIS OF ADHESIONS  03/03/2012   Procedure: LAPAROSCOPIC LYSIS OF ADHESIONS;  Surgeon: Alm JAYSON Cook, MD;  Location: [facility];  Service: Gynecology;  Laterality: N/A;   LAPAROSCOPY  03/03/2012   Procedure: LAPAROSCOPY OPERATIVE;  Surgeon: Alm JAYSON Cook, MD;  Location: [facility];  Service: Gynecology;  Laterality: N/A;   NASAL SINUS SURGERY  DEC 2009   PUBOVAGINAL SLING  04/05/2011   Procedure: CARLOYN GLADE;  Surgeon: Arlena LILLETTE Gal, MD;  Location: [facility];  Service: Urology;  Laterality: N/A;  ANTERIOR UPHOLD LITE   AND CYSTOSCOPY   RECTOCELE REPAIR N/A 08/18/2012   Procedure:  REPAIR POSTERIOR VAGINAL WALL SCAR AND VAGINOPLASTY ;  Surgeon: Arlena LILLETTE Gal, MD;  Location: [facility] ;  Service: Urology;  Laterality: N/A;   RIGHT SHOULDER SURGERY  DEC 2005   TUBAL LIGATION  1980   VAGINAL PROLAPSE REPAIR  03/03/2012   Procedure: VAGINAL VAULT SUSPENSION;  Surgeon: Arlena LILLETTE Gal, MD;  Location: [facility];  Service: Urology;  Laterality: N/A;  repair with xenform with ileococcygeus repair   Patient Active Problem List   Diagnosis Date Noted   SOB (shortness of breath) 11/04/2014   Constipation 01/08/2011    REFERRING PROVIDER: Elspeth Her MD  REFERRING DIAG: Impingement syndrome of right shoulder.  THERAPY DIAG:  Acute pain of right shoulder  Other muscle spasm  Rationale for Evaluation and Treatment: Rehabilitation  ONSET DATE: ~2 months ago.  SUBJECTIVE:  SUBJECTIVE STATEMENT: Pt reports 5/10 right shoulder pain today.   PERTINENT HISTORY: Cervical dystonia, OP.  Patient states she sees a Chiropractor once a week for her neck and mid-back.  Facial nerve palsy.  PAIN:  Are you having pain? Yes: NPRS scale: 5/10 Pain location: Right shoulder. Pain description: As above. Aggravating factors: As above.   Relieving factors: As above.    PRECAUTIONS: Other: OP.  RED FLAGS: None   WEIGHT BEARING RESTRICTIONS: No  FALLS:  Has patient fallen in last 6 months? No  LIVING ENVIRONMENT: Lives in: House/apartment Has following equipment at home: None  OCCUPATION: Retired.    PLOF: Independent  PATIENT GOALS:Use right shoulder without pain.    NEXT MD VISIT:   OBJECTIVE:  PATIENT SURVEYS:  Quick DASH:  31.82  POSTURE: Forward head, rounded shoulder, cervical rotation and tilt due to dystonia.    UPPER EXTREMITY ROM:    Essentially full right shoulder flexion (patient's daughter who was present states that prior to her injection she struggled much more with this movement).  Full ER and behind back motion.    UPPER EXTREMITY MMT:  IR/ER with elbow by side is a solid 4+/5.  Flexion and abduction is 4+/5.  SHOULDER SPECIAL TESTS: Some pain reproduction with a right Speed's test and Impingement test.  Normal UE DTR's.  PALPATION:  Tender to palpation over right bicipital groove, anterior deltoid, acromial ridge and middle deltoid.  Her right UT is remarkable for increased tone and a trigger point.                                                                                                                               TREATMENT DATE:   12/17/23    EXERCISE LOG  Exercise Repetitions and Resistance Comments  UBE  10 minutes @ 120 RPM    Pulleys   4 minutes  Flexion  Supine cane flexion  20 reps   Supine cane horizontal ABD  20 reps  At 90 degrees shoulder flexion   Manual therapy See below    Resisted punch out  Red t-band x 2 x 20 reps    Resisted ER  Red t-band x 2 x 20 reps    Resisted extension Red t-band x 2 x 20 reps   Resisted row  Red r-band x 2 x 20 reps    Resisted deltoid fly     Resisted horizontal ABD  Yellow t-band x 2 x 20 reps     Blank cell = exercise not performed today  Manual Therapy Soft Tissue Mobilization: right biceps and middle deltoid, for reduced pain and tone  Modalities: no redness or adverse reaction to today's modalities   Date:  Unattended Estim: right upper trapezius and deltoid, pre mod @ 80-150 Hz, 15 mins, Pain and Tone  12/13/23:  UBE x 8 minutes f/b red theraband ER to fatigue x 2 f/b STW/M x 13 minutes to right UT/Supraspinatus and  middle deltoid f/b IFC at 80-150 Hz on 40% scan x 20 minutes.  Normal modality response following removal of modality                                   12/06/23 EXERCISE LOG  Exercise Repetitions and Resistance Comments  UBE  8  minutes @ 120 RPM    Pulleys   4 minutes  Flexion  Supine cane flexion  20 reps   Supine cane horizontal ABD  20 reps  At 90 degrees shoulder flexion   Manual therapy See below    Resisted punch out  Red t-band x 2 x 15 reps    Resisted ER  Red t-band x 2 x 15 reps    Resisted row  Red r-band x 2 x 15 reps    Resisted deltoid fly     Resisted horizontal ABD  Yellow t-band x 2 x 15 reps     Blank cell = exercise not performed today  Manual Therapy Soft Tissue Mobilization: right biceps, for reduced pain and tone  Modalities: no redness or adverse reaction to today's modalities   Date:  Unattended Estim: right upper trapezius, pre mod @ 80-150 Hz, 15 mins, Pain and Tone    PATIENT EDUCATION: Education details: See below. Person educated: Patient. Education method: demo Education comprehension: Handout  HOME EXERCISE PROGRAM: RW4...red theraband provided.    ASSESSMENT:  CLINICAL IMPRESSION: Pt arrives for today's treatment session reporting 5/10 right shoulder pain.  Pt able to demonstrate 4+/5 global right shoulder strength, making good progress towards her long term strength goal.  Pt's QuickDash score remains 31.8% which is unchanged since eval.  Pt reports that she can perform all ADLs without pain going above 3/10, meeting her LTG.  Pt able to tolerate increased reps with all previously performed t-band exercises today without issue.  STW/M performed to right bicep and deltoid to decrease pain and tone.  Normal responses to estim noted upon removal.  Pt reported 3/10 right shoulder pain at completion of today's treatment session.     OBJECTIVE IMPAIRMENTS: decreased activity tolerance, decreased strength, increased muscle spasms, and pain.   ACTIVITY LIMITATIONS: carrying, lifting, and reach over head  PARTICIPATION LIMITATIONS: meal prep, cleaning, and laundry  PERSONAL FACTORS: 1 comorbidity: cervical dystonia are also affecting patient's functional outcome.   REHAB  POTENTIAL: Good  CLINICAL DECISION MAKING: Stable/uncomplicated  EVALUATION COMPLEXITY: Low   GOALS:  SHORT TERM GOALS: Target date: 11/21/23  Ind with a HEP. Goal status: MET   LONG TERM GOALS: Target date: 12/19/23  Patient perform ADL's with right shoulder pain not > 2-3/10.  Goal status: MET   2.  Improve right shoulder strength to 5/5.   8/19: 4+/5 global right shoulder strength Goal status: IN PROGRESS  3.  Improve Quick DASH score by at least 15%. 8/19: 31.8%  Goal status: IN PROGRESS   PLAN:  PT FREQUENCY: 2x/week  PT DURATION: 6 weeks  PLANNED INTERVENTIONS: 97110-Therapeutic exercises, 97530- Therapeutic activity, 97112- Neuromuscular re-education, 97535- Self Care, 02859- Manual therapy, G0283- Electrical stimulation (unattended), 97035- Ultrasound, 79439 (1-2 muscles), 20561 (3+ muscles)- Dry Needling, Patient/Family education, Cryotherapy, and Moist heat  PLAN FOR NEXT SESSION: UBE, RW4, combo e'stim/US  and STW/M.     Delon DELENA Gosling, PTA 12/17/2023, 10:47 AM   Progress Note Reporting Period 11/07/23 to 12/17/23.  See note below for Objective Data and Assessment of Progress/Goals. Goals  ongoing.    Chad Applegate MPT

## 2023-12-19 ENCOUNTER — Ambulatory Visit

## 2023-12-19 DIAGNOSIS — M25511 Pain in right shoulder: Secondary | ICD-10-CM | POA: Diagnosis not present

## 2023-12-19 DIAGNOSIS — M62838 Other muscle spasm: Secondary | ICD-10-CM | POA: Diagnosis not present

## 2023-12-19 NOTE — Therapy (Signed)
 OUTPATIENT PHYSICAL THERAPY SHOULDER TREATMENT   Patient Name: [name] MRN: [id number] DOB:07/13/1950, 73 y.o., female Today's Date: 12/19/2023  END OF SESSION:  PT End of Session - 12/19/23 0935     Visit Number 11    Number of Visits 12    Date for PT Re-Evaluation 12/19/23    PT Start Time 0930    PT Stop Time 1024    PT Time Calculation (min) 54 min    Activity Tolerance Patient tolerated treatment well    Behavior During Therapy WFL for tasks assessed/performed             Past Medical History:  Diagnosis Date   Anxiety    hx panic attacks;  no meds   Arthritis NECK AND SHOULDERS   Chronic facial pain SECONDARY TO NERVE PALSY--  TAKES GABAPENTIN    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   Frequency of urination    GERD (gastroesophageal reflux disease)    H/O hiatal hernia    History of kidney stones    Interstitial cystitis    Nocturia    PONV (postoperative nausea and vomiting)    Urgency of urination    Past Surgical History:  Procedure Laterality Date   ABDOMINAL HYSTERECTOMY  1987   AND BURCH PROCEDURE  (BLADDER SUSPENSION)   ATTEMPTED URETEROSCOPIC STONE EXTRACTION  2003   BLADDER SLING PROCEDURE  2005   BREAST REDUCTION SURGERY  2009   BILATERAL   BUNIONECTOMY  SEPT 2005   LEFT FOOT   EXTRACORPOREAL SHOCK WAVE LITHOTRIPSY  2002   RIGHT SIDE   LAPAROSCOPIC LYSIS OF ADHESIONS  03/03/2012   Procedure: LAPAROSCOPIC LYSIS OF ADHESIONS;  Surgeon: Alm JAYSON Cook, MD;  Location: [facility];  Service: Gynecology;  Laterality: N/A;   LAPAROSCOPY  03/03/2012   Procedure: LAPAROSCOPY OPERATIVE;  Surgeon: Alm JAYSON Cook, MD;  Location: [facility];  Service: Gynecology;  Laterality: N/A;   NASAL SINUS SURGERY  DEC 2009   PUBOVAGINAL SLING  04/05/2011   Procedure: CARLOYN GLADE;  Surgeon: Arlena LILLETTE Gal, MD;  Location: [facility];  Service: Urology;  Laterality: N/A;  ANTERIOR UPHOLD LITE   AND  CYSTOSCOPY   RECTOCELE REPAIR N/A 08/18/2012   Procedure: REPAIR POSTERIOR VAGINAL WALL SCAR AND VAGINOPLASTY ;  Surgeon: Arlena LILLETTE Gal, MD;  Location: [facility];  Service: Urology;  Laterality: N/A;   RIGHT SHOULDER SURGERY  DEC 2005   TUBAL LIGATION  1980   VAGINAL PROLAPSE REPAIR  03/03/2012   Procedure: VAGINAL VAULT SUSPENSION;  Surgeon: Arlena LILLETTE Gal, MD;  Location: [facility];  Service: Urology;  Laterality: N/A;  repair with xenform with ileococcygeus repair   Patient Active Problem List   Diagnosis Date Noted   SOB (shortness of breath) 11/04/2014   Constipation 01/08/2011    REFERRING PROVIDER: Elspeth Her MD  REFERRING DIAG: Impingement syndrome of right shoulder.  THERAPY DIAG:  Acute pain of right shoulder  Other muscle spasm  Rationale for Evaluation and Treatment: Rehabilitation  ONSET DATE: ~2 months ago.  SUBJECTIVE:  SUBJECTIVE STATEMENT: Pt reports 5/10 right shoulder pain today.   PERTINENT HISTORY: Cervical dystonia, OP.  Patient states she sees a Chiropractor once a week for her neck and mid-back.  Facial nerve palsy.  PAIN:  Are you having pain? Yes: NPRS scale: 5/10 Pain location: Right shoulder. Pain description: As above. Aggravating factors: As above.   Relieving factors: As above.    PRECAUTIONS: Other: OP.  RED FLAGS: None   WEIGHT BEARING RESTRICTIONS: No  FALLS:  Has patient fallen in last 6 months? No  LIVING ENVIRONMENT: Lives in: House/apartment Has following equipment at home: None  OCCUPATION: Retired.    PLOF: Independent  PATIENT GOALS:Use right shoulder without pain.    NEXT MD VISIT:   OBJECTIVE:  PATIENT SURVEYS:  Quick DASH:  31.82  POSTURE: Forward head, rounded shoulder, cervical  rotation and tilt due to dystonia.    UPPER EXTREMITY ROM:   Essentially full right shoulder flexion (patient's daughter who was present states that prior to her injection she struggled much more with this movement).  Full ER and behind back motion.    UPPER EXTREMITY MMT:  IR/ER with elbow by side is a solid 4+/5.  Flexion and abduction is 4+/5.  SHOULDER SPECIAL TESTS: Some pain reproduction with a right Speed's test and Impingement test.  Normal UE DTR's.  PALPATION:  Tender to palpation over right bicipital groove, anterior deltoid, acromial ridge and middle deltoid.  Her right UT is remarkable for increased tone and a trigger point.                                                                                                                               TREATMENT DATE:   12/19/23    EXERCISE LOG  Exercise Repetitions and Resistance Comments  UBE  10 minutes @ 120 RPM    Pulleys   5 minutes  Flexion  Weight to Second Shelf 2# 2 sets of 10 reps   Supine cane flexion     Supine cane horizontal ABD   At 90 degrees shoulder flexion   Manual therapy See below    Resisted punch out  Green t-band  x 20 reps    Resisted ER  Green t-band  x 20 reps    Resisted extension Green t-band  x 20 reps   Resisted row  Green r-band  x 20 reps    Resisted deltoid fly     Resisted horizontal ABD  Red t-band x 20 reps     Blank cell = exercise not performed today   Manual Therapy Soft Tissue Mobilization: right biceps and middle deltoid, for reduced pain and tone  Modalities: no redness or adverse reaction to today's modalities   Date:  Unattended Estim: right upper trapezius and deltoid, pre mod @ 80-150 Hz, 15 mins, Pain and Tone  12/13/23:  UBE x 8 minutes f/b red theraband ER to fatigue x 2 f/b STW/M x 13 minutes  to right UT/Supraspinatus and middle deltoid f/b IFC at 80-150 Hz on 40% scan x 20 minutes.  Normal modality response following removal of modality                                    12/06/23 EXERCISE LOG  Exercise Repetitions and Resistance Comments  UBE  8 minutes @ 120 RPM    Pulleys   4 minutes  Flexion  Supine cane flexion  20 reps   Supine cane horizontal ABD  20 reps  At 90 degrees shoulder flexion   Manual therapy See below    Resisted punch out  Red t-band x 2 x 15 reps    Resisted ER  Red t-band x 2 x 15 reps    Resisted row  Red r-band x 2 x 15 reps    Resisted deltoid fly     Resisted horizontal ABD  Yellow t-band x 2 x 15 reps     Blank cell = exercise not performed today  Manual Therapy Soft Tissue Mobilization: right biceps, for reduced pain and tone  Modalities: no redness or adverse reaction to today's modalities   Date:  Unattended Estim: right upper trapezius, pre mod @ 80-150 Hz, 15 mins, Pain and Tone    PATIENT EDUCATION: Education details: See below. Person educated: Patient. Education method: demo Education comprehension: Handout  HOME EXERCISE PROGRAM: RW4...red theraband provided.    ASSESSMENT:  CLINICAL IMPRESSION: Pt arrives for today's treatment session reporting 5/10 right shoulder pain and 10/10 low back pain.   Pt introduced to placing 2# weight on/off of a second shelf for a more functional exercise.  Pt able to tolerate increased resistance with all thera-band exercises today without pain, but does report increased fatigue. STW/M to right deltoid and bicep to decrease pain and tone.  Normal responses to estim noted upon removal.  PT reported 3/10 right shoulder pain at completion of today's treatment session.   OBJECTIVE IMPAIRMENTS: decreased activity tolerance, decreased strength, increased muscle spasms, and pain.   ACTIVITY LIMITATIONS: carrying, lifting, and reach over head  PARTICIPATION LIMITATIONS: meal prep, cleaning, and laundry  PERSONAL FACTORS: 1 comorbidity: cervical dystonia are also affecting patient's functional outcome.   REHAB POTENTIAL: Good  CLINICAL DECISION MAKING:  Stable/uncomplicated  EVALUATION COMPLEXITY: Low   GOALS:  SHORT TERM GOALS: Target date: 11/21/23  Ind with a HEP. Goal status: MET   LONG TERM GOALS: Target date: 12/19/23  Patient perform ADL's with right shoulder pain not > 2-3/10.  Goal status: MET   2.  Improve right shoulder strength to 5/5.   8/19: 4+/5 global right shoulder strength Goal status: IN PROGRESS  3.  Improve Quick DASH score by at least 15%. 8/19: 31.8%  Goal status: IN PROGRESS   PLAN:  PT FREQUENCY: 2x/week  PT DURATION: 6 weeks  PLANNED INTERVENTIONS: 97110-Therapeutic exercises, 97530- Therapeutic activity, 97112- Neuromuscular re-education, 97535- Self Care, 02859- Manual therapy, G0283- Electrical stimulation (unattended), 97035- Ultrasound, 79439 (1-2 muscles), 20561 (3+ muscles)- Dry Needling, Patient/Family education, Cryotherapy, and Moist heat  PLAN FOR NEXT SESSION: UBE, RW4, combo e'stim/US  and STW/M.     Delon DELENA Gosling, PTA 12/19/2023, 10:41 AM

## 2023-12-24 ENCOUNTER — Ambulatory Visit

## 2023-12-24 DIAGNOSIS — M25511 Pain in right shoulder: Secondary | ICD-10-CM

## 2023-12-24 DIAGNOSIS — M62838 Other muscle spasm: Secondary | ICD-10-CM | POA: Diagnosis not present

## 2023-12-24 NOTE — Therapy (Addendum)
 OUTPATIENT PHYSICAL THERAPY SHOULDER TREATMENT   Patient Name: [name] MRN: [id number] DOB:09-Apr-1951, 73 y.o., female Today's Date: 12/24/2023  END OF SESSION:  PT End of Session - 12/24/23 0930     Visit Number 12    Number of Visits 12    Date for PT Re-Evaluation 12/19/23    PT Start Time 0930    Activity Tolerance Patient tolerated treatment well    Behavior During Therapy WFL for tasks assessed/performed             Past Medical History:  Diagnosis Date   Anxiety    hx panic attacks;  no meds   Arthritis NECK AND SHOULDERS   Chronic facial pain SECONDARY TO NERVE PALSY--  TAKES GABAPENTIN    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   Frequency of urination    GERD (gastroesophageal reflux disease)    H/O hiatal hernia    History of kidney stones    Interstitial cystitis    Nocturia    PONV (postoperative nausea and vomiting)    Urgency of urination    Past Surgical History:  Procedure Laterality Date   ABDOMINAL HYSTERECTOMY  1987   AND BURCH PROCEDURE  (BLADDER SUSPENSION)   ATTEMPTED URETEROSCOPIC STONE EXTRACTION  2003   BLADDER SLING PROCEDURE  2005   BREAST REDUCTION SURGERY  2009   BILATERAL   BUNIONECTOMY  SEPT 2005   LEFT FOOT   EXTRACORPOREAL SHOCK WAVE LITHOTRIPSY  2002   RIGHT SIDE   LAPAROSCOPIC LYSIS OF ADHESIONS  03/03/2012   Procedure: LAPAROSCOPIC LYSIS OF ADHESIONS;  Surgeon: Alm JAYSON Cook, MD;  Location: [facility] [place];  Service: Gynecology;  Laterality: N/A;   LAPAROSCOPY  03/03/2012   Procedure: LAPAROSCOPY OPERATIVE;  Surgeon: Alm JAYSON Cook, MD;  Location: [facility];  Service: Gynecology;  Laterality: N/A;   NASAL SINUS SURGERY  DEC 2009   PUBOVAGINAL SLING  04/05/2011   Procedure: CARLOYN GLADE;  Surgeon: Arlena LILLETTE Gal, MD;  Location: [facility];  Service: Urology;  Laterality: N/A;  ANTERIOR UPHOLD LITE   AND CYSTOSCOPY   RECTOCELE REPAIR N/A 08/18/2012   Procedure:  REPAIR POSTERIOR VAGINAL WALL SCAR AND VAGINOPLASTY ;  Surgeon: Arlena LILLETTE Gal, MD;  Location: [facility];  Service: Urology;  Laterality: N/A;   RIGHT SHOULDER SURGERY  DEC 2005   TUBAL LIGATION  1980   VAGINAL PROLAPSE REPAIR  03/03/2012   Procedure: VAGINAL VAULT SUSPENSION;  Surgeon: Arlena LILLETTE Gal, MD;  Location: [facility];  Service: Urology;  Laterality: N/A;  repair with xenform with ileococcygeus repair   Patient Active Problem List   Diagnosis Date Noted   SOB (shortness of breath) 11/04/2014   Constipation 01/08/2011    REFERRING PROVIDER: Elspeth Her MD  REFERRING DIAG: Impingement syndrome of right shoulder.  THERAPY DIAG:  Acute pain of right shoulder  Other muscle spasm  Rationale for Evaluation and Treatment: Rehabilitation  ONSET DATE: ~2 months ago.  SUBJECTIVE:  SUBJECTIVE STATEMENT: Pt reports 7/10 right shoulder pain today.  Pt ready for discharge today.   PERTINENT HISTORY: Cervical dystonia, OP.  Patient states she sees a Chiropractor once a week for her neck and mid-back.  Facial nerve palsy.  PAIN:  Are you having pain? Yes: NPRS scale: 7/10 Pain location: Right shoulder. Pain description: As above. Aggravating factors: As above.   Relieving factors: As above.    PRECAUTIONS: Other: OP.  RED FLAGS: None   WEIGHT BEARING RESTRICTIONS: No  FALLS:  Has patient fallen in last 6 months? No  LIVING ENVIRONMENT: Lives in: House/apartment Has following equipment at home: None  OCCUPATION: Retired.    PLOF: Independent  PATIENT GOALS:Use right shoulder without pain.    NEXT MD VISIT:   OBJECTIVE:  PATIENT SURVEYS:  Quick DASH:  31.82  POSTURE: Forward head, rounded shoulder, cervical rotation and tilt due to  dystonia.    UPPER EXTREMITY ROM:   Essentially full right shoulder flexion (patient's daughter who was present states that prior to her injection she struggled much more with this movement).  Full ER and behind back motion.    UPPER EXTREMITY MMT:  IR/ER with elbow by side is a solid 4+/5.  Flexion and abduction is 4+/5.  SHOULDER SPECIAL TESTS: Some pain reproduction with a right Speed's test and Impingement test.  Normal UE DTR's.  PALPATION:  Tender to palpation over right bicipital groove, anterior deltoid, acromial ridge and middle deltoid.  Her right UT is remarkable for increased tone and a trigger point.                                                                                                                               TREATMENT DATE:   12/24/23    EXERCISE LOG  Exercise Repetitions and Resistance Comments  UBE  10 minutes @ 120 RPM    Pulleys   5 minutes  Flexion  Weight to Second Shelf    Supine cane flexion     Supine cane horizontal ABD   At 90 degrees shoulder flexion   Manual therapy See below    Resisted punch out     Resisted ER     Resisted extension    Resisted row     Resisted deltoid fly     Resisted horizontal ABD      Blank cell = exercise not performed today   Manual Therapy Soft Tissue Mobilization: right biceps and anterior deltoid, for reduced pain and tone   Modalities: no redness or adverse reaction to today's modalities   Date:  Unattended Estim: right upper trapezius and deltoid, pre mod @ 80-150 Hz, 15 mins, Pain and Tone  12/13/23:  UBE x 8 minutes f/b red theraband ER to fatigue x 2 f/b STW/M x 13 minutes to right UT/Supraspinatus and middle deltoid f/b IFC at 80-150 Hz on 40% scan x 20 minutes.  Normal modality response following removal of modality  12/06/23 EXERCISE LOG  Exercise Repetitions and Resistance Comments  UBE  8 minutes @ 120 RPM    Pulleys   4 minutes  Flexion  Supine cane flexion   20 reps   Supine cane horizontal ABD  20 reps  At 90 degrees shoulder flexion   Manual therapy See below    Resisted punch out  Red t-band x 2 x 15 reps    Resisted ER  Red t-band x 2 x 15 reps    Resisted row  Red r-band x 2 x 15 reps    Resisted deltoid fly     Resisted horizontal ABD  Yellow t-band x 2 x 15 reps     Blank cell = exercise not performed today  Manual Therapy Soft Tissue Mobilization: right biceps, for reduced pain and tone  Modalities: no redness or adverse reaction to today's modalities   Date:  Unattended Estim: right upper trapezius, pre mod @ 80-150 Hz, 15 mins, Pain and Tone    PATIENT EDUCATION: Education details: See below. Person educated: Patient. Education method: demo Education comprehension: Handout  HOME EXERCISE PROGRAM: RW4...red theraband provided.    ASSESSMENT:  CLINICAL IMPRESSION: Pt arrives for today's treatment session reporting 7/10 right shoulder pain.  Pt able to demonstrate 5/5 global right shoulder strength, meeting her strength goal.  Pt QuickDASH score increased to 43.1% today.  Pt encouraged to reach out to referring provider to discuss the next steps.  STW/M performed to anterior deltoid and bicep to decrease pain and tone. Normal responses to estim noted upon removal.  Pt given print out to purchase TENS unit for home use.  Pt encouraged to call the facility with any questions or concerns.  Pt reported 5/10 right shoulder pain at completion of today's treatment session.   OBJECTIVE IMPAIRMENTS: decreased activity tolerance, decreased strength, increased muscle spasms, and pain.   ACTIVITY LIMITATIONS: carrying, lifting, and reach over head  PARTICIPATION LIMITATIONS: meal prep, cleaning, and laundry  PERSONAL FACTORS: 1 comorbidity: cervical dystonia are also affecting patient's functional outcome.   REHAB POTENTIAL: Good  CLINICAL DECISION MAKING: Stable/uncomplicated  EVALUATION COMPLEXITY: Low   GOALS:  SHORT TERM  GOALS: Target date: 11/21/23  Ind with a HEP. Goal status: MET   LONG TERM GOALS: Target date: 12/19/23  Patient perform ADL's with right shoulder pain not > 2-3/10.  Goal status: MET   2.  Improve right shoulder strength to 5/5.  8/19: 4+/5 global right shoulder strength; 8/26: 5/5 global right shoulder strength Goal status: MET  3.  Improve Quick DASH score by at least 15%. 8/19: 31.8%; 8/26: 43.1% Goal status: IN PROGRESS   PLAN:  PT FREQUENCY: 2x/week  PT DURATION: 6 weeks  PLANNED INTERVENTIONS: 97110-Therapeutic exercises, 97530- Therapeutic activity, 97112- Neuromuscular re-education, 97535- Self Care, 02859- Manual therapy, G0283- Electrical stimulation (unattended), 97035- Ultrasound, 79439 (1-2 muscles), 20561 (3+ muscles)- Dry Needling, Patient/Family education, Cryotherapy, and Moist heat  PLAN FOR NEXT SESSION: UBE, RW4, combo e'stim/US  and STW/M.     Delon DELENA Gosling, PTA 12/24/2023, 10:30 AM   PHYSICAL THERAPY DISCHARGE SUMMARY  Visits from Start of Care: 12.  Current functional level related to goals / functional outcomes: See above.     Remaining deficits: Continued pain but LTG's #1 and 2 met.   Education / Equipment: HEP.   Patient agrees to discharge. Patient goals were partially met. Patient is being discharged due to completing course of PT..   Chad Applegate MPT

## 2024-01-15 DIAGNOSIS — Z411 Encounter for cosmetic surgery: Secondary | ICD-10-CM | POA: Diagnosis not present

## 2024-01-15 DIAGNOSIS — G243 Spasmodic torticollis: Secondary | ICD-10-CM | POA: Diagnosis not present

## 2024-01-15 DIAGNOSIS — R1310 Dysphagia, unspecified: Secondary | ICD-10-CM | POA: Diagnosis not present

## 2024-01-28 DIAGNOSIS — M25511 Pain in right shoulder: Secondary | ICD-10-CM | POA: Diagnosis not present

## 2024-02-04 DIAGNOSIS — M25511 Pain in right shoulder: Secondary | ICD-10-CM | POA: Diagnosis not present

## 2024-02-04 DIAGNOSIS — M7541 Impingement syndrome of right shoulder: Secondary | ICD-10-CM | POA: Diagnosis not present

## 2024-02-04 DIAGNOSIS — M75102 Unspecified rotator cuff tear or rupture of left shoulder, not specified as traumatic: Secondary | ICD-10-CM | POA: Diagnosis not present

## 2024-02-10 DIAGNOSIS — H02831 Dermatochalasis of right upper eyelid: Secondary | ICD-10-CM | POA: Diagnosis not present

## 2024-02-10 DIAGNOSIS — H57813 Brow ptosis, bilateral: Secondary | ICD-10-CM | POA: Diagnosis not present

## 2024-02-10 DIAGNOSIS — H029 Unspecified disorder of eyelid: Secondary | ICD-10-CM | POA: Diagnosis not present

## 2024-02-10 DIAGNOSIS — H02834 Dermatochalasis of left upper eyelid: Secondary | ICD-10-CM | POA: Diagnosis not present

## 2024-02-13 DIAGNOSIS — M810 Age-related osteoporosis without current pathological fracture: Secondary | ICD-10-CM | POA: Diagnosis not present

## 2024-02-26 DIAGNOSIS — M810 Age-related osteoporosis without current pathological fracture: Secondary | ICD-10-CM | POA: Diagnosis not present

## 2024-03-30 DIAGNOSIS — H26493 Other secondary cataract, bilateral: Secondary | ICD-10-CM | POA: Diagnosis not present

## 2024-03-30 DIAGNOSIS — Z947 Corneal transplant status: Secondary | ICD-10-CM | POA: Diagnosis not present

## 2024-03-30 DIAGNOSIS — H029 Unspecified disorder of eyelid: Secondary | ICD-10-CM | POA: Diagnosis not present

## 2024-03-30 DIAGNOSIS — G500 Trigeminal neuralgia: Secondary | ICD-10-CM | POA: Diagnosis not present

## 2024-03-30 DIAGNOSIS — H40023 Open angle with borderline findings, high risk, bilateral: Secondary | ICD-10-CM | POA: Diagnosis not present

## 2024-04-01 DIAGNOSIS — Y999 Unspecified external cause status: Secondary | ICD-10-CM | POA: Diagnosis not present

## 2024-04-01 DIAGNOSIS — X58XXXA Exposure to other specified factors, initial encounter: Secondary | ICD-10-CM | POA: Diagnosis not present

## 2024-04-01 DIAGNOSIS — G8918 Other acute postprocedural pain: Secondary | ICD-10-CM | POA: Diagnosis not present

## 2024-04-01 DIAGNOSIS — S46011A Strain of muscle(s) and tendon(s) of the rotator cuff of right shoulder, initial encounter: Secondary | ICD-10-CM | POA: Diagnosis not present

## 2024-04-01 DIAGNOSIS — S43431A Superior glenoid labrum lesion of right shoulder, initial encounter: Secondary | ICD-10-CM | POA: Diagnosis not present

## 2024-04-01 DIAGNOSIS — M7521 Bicipital tendinitis, right shoulder: Secondary | ICD-10-CM | POA: Diagnosis not present

## 2024-04-01 DIAGNOSIS — M7541 Impingement syndrome of right shoulder: Secondary | ICD-10-CM | POA: Diagnosis not present

## 2024-04-06 ENCOUNTER — Ambulatory Visit: Admitting: Physical Therapy

## 2024-04-06 ENCOUNTER — Other Ambulatory Visit: Payer: Self-pay

## 2024-04-06 ENCOUNTER — Encounter: Payer: Self-pay | Admitting: Physical Therapy

## 2024-04-06 DIAGNOSIS — M25511 Pain in right shoulder: Secondary | ICD-10-CM | POA: Insufficient documentation

## 2024-04-06 DIAGNOSIS — M62838 Other muscle spasm: Secondary | ICD-10-CM | POA: Insufficient documentation

## 2024-04-06 DIAGNOSIS — G8929 Other chronic pain: Secondary | ICD-10-CM | POA: Insufficient documentation

## 2024-04-06 DIAGNOSIS — M25611 Stiffness of right shoulder, not elsewhere classified: Secondary | ICD-10-CM | POA: Insufficient documentation

## 2024-04-06 NOTE — Therapy (Addendum)
 OUTPATIENT PHYSICAL THERAPY SHOULDER EVALUATION   Patient Name: [name] MRN: [id number] DOB:[birth year], 73 y.o., female Today's Date: 04/06/2024  END OF SESSION:  PT End of Session - 04/06/24 1404     Visit Number 1    Number of Visits 12    Date for Recertification  05/18/24    PT Start Time 1257    PT Stop Time 1348    PT Time Calculation (min) 51 min    Activity Tolerance Patient tolerated treatment well    Behavior During Therapy WFL for tasks assessed/performed          Past Medical History:  Diagnosis Date   Anxiety    hx panic attacks;  no meds   Arthritis NECK AND SHOULDERS   Chronic facial pain SECONDARY TO NERVE PALSY--  TAKES GABAPENTIN    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   Frequency of urination    GERD (gastroesophageal reflux disease)    H/O hiatal hernia    History of kidney stones    Interstitial cystitis    Nocturia    PONV (postoperative nausea and vomiting)    Urgency of urination    Past Surgical History:  Procedure Laterality Date   ABDOMINAL HYSTERECTOMY  1987   AND BURCH PROCEDURE  (BLADDER SUSPENSION)   ATTEMPTED URETEROSCOPIC STONE EXTRACTION  2003   BLADDER SLING PROCEDURE  2005   BREAST REDUCTION SURGERY  2009   BILATERAL   BUNIONECTOMY  SEPT 2005   LEFT FOOT   EXTRACORPOREAL SHOCK WAVE LITHOTRIPSY  2002   RIGHT SIDE   LAPAROSCOPIC LYSIS OF ADHESIONS  03/03/2012   Procedure: LAPAROSCOPIC LYSIS OF ADHESIONS;  Surgeon: Alm JAYSON Cook, MD;  Location: [facility] [place];  Service: Gynecology;  Laterality: N/A;   LAPAROSCOPY  03/03/2012   Procedure: LAPAROSCOPY OPERATIVE;  Surgeon: Alm JAYSON Cook, MD;  Location: [facility];  Service: Gynecology;  Laterality: N/A;   NASAL SINUS SURGERY  DEC 2009   PUBOVAGINAL SLING  04/05/2011   Procedure: CARLOYN GLADE;  Surgeon: Arlena LILLETTE Gal, MD;  Location: [facility];  Service: Urology;  Laterality: N/A;  ANTERIOR UPHOLD LITE   AND  CYSTOSCOPY   RECTOCELE REPAIR N/A 08/18/2012   Procedure: REPAIR POSTERIOR VAGINAL WALL SCAR AND VAGINOPLASTY ;  Surgeon: Arlena LILLETTE Gal, MD;  Location: [facility] [place];  Service: Urology;  Laterality: N/A;   RIGHT SHOULDER SURGERY  DEC 2005   TUBAL LIGATION  1980   VAGINAL PROLAPSE REPAIR  03/03/2012   Procedure: VAGINAL VAULT SUSPENSION;  Surgeon: Arlena LILLETTE Gal, MD;  Location: [facility];  Service: Urology;  Laterality: N/A;  repair with xenform with ileococcygeus repair   Patient Active Problem List   Diagnosis Date Noted   SOB (shortness of breath) 11/04/2014   Constipation 01/08/2011   REFERRING PROVIDER: Elspeth Her MD  REFERRING DIAG: Tear of RTC s/p surgery.    THERAPY DIAG:  Chronic right shoulder pain  Stiffness of right shoulder, not elsewhere classified  Rationale for Evaluation and Treatment: Rehabilitation  ONSET DATE: Surgery date (04/01/24).    SUBJECTIVE:  SUBJECTIVE STATEMENT: The patient presents to the clinic s/p right shoulder surgery (RCR).  Her pain-level is a 7/10.  She is compliant to wearing her sling with abduction pillow.  She is doing pendulum, thigh slides and hitch-hiker exercise at home.    PERTINENT HISTORY: Cervical dystonia.  OP.  PAIN:  Are you having pain? Yes: NPRS scale: 7/10.   Pain location: Right shoulder. Pain description: Throbbing and sore.   Aggravating factors: Hanging down.   Relieving factors: Elevate  PRECAUTIONS: Other: Progress per protocol.    RED FLAGS: None   WEIGHT BEARING RESTRICTIONS: No right UE weight bearing.  FALLS:  Has patient fallen in last 6 months? Yes. Number of falls 1.  LIVING ENVIRONMENT: Lives with: lives with their family Lives in: House/apartment Has following equipment at  home: None  OCCUPATION: Retired.  PLOF: Independent  PATIENT GOALS:  Use right shoulder without pain.    NEXT MD VISIT:   OBJECTIVE:   PATIENT SURVEYS:  QUICKDASH:  48 points (84%).     UPPER EXTREMITY ROM:   In supine:  Gentle P/AROM flexion is 80 degrees and ER is 30 degrees.   PALPATION:  The patient c/o diffuse right shoulder pain.  Post-surgical dressing removed and steri-strips intact.  Ecchymosis present as expected.                                                                                                                               TREATMENT DATE: 04/06/24:  In supine:  Gentle P/AROM into right shoulder flexion and ER x 8 minutes f/n vasopneumatic x 18 minutes on low with pillow between right elbow and thorax.     PATIENT EDUCATION: Education details: Review of HEP. Person educated: Patient and Caregiver   Education method: Medical Illustrator Education comprehension: verbalized understanding  HOME EXERCISE PROGRAM:   ASSESSMENT:  CLINICAL IMPRESSION: The patient presents to OPPT s/p right surgery RCR performed on 04/01/24.  She is compliant to sling usage and is performing an HEP.  She has a loss of range of motion as expected. Her steri-strips are intact Her QUICKDASH score is 48 points (84%).  Patient will benefit from skilled physical therapy intervention to address pain and deficits.  OBJECTIVE IMPAIRMENTS: decreased activity tolerance, decreased ROM, and pain.   ACTIVITY LIMITATIONS: carrying, lifting, bending, bathing, dressing, and reach over head  PARTICIPATION LIMITATIONS: meal prep, cleaning, and laundry  PERSONAL FACTORS: Time since onset of injury/illness/exacerbation and 1 comorbidity: cervical dystonia are also affecting patient's functional outcome.   REHAB POTENTIAL: Good  CLINICAL DECISION MAKING: Stable/uncomplicated  EVALUATION COMPLEXITY: Low   GOALS:  SHORT TERM GOALS: Target date: 04/20/24.  Ind with an  initial HEP. Goal status: INITIAL   LONG TERM GOALS: Target date: 07/05/24.  Ind with an advanced HEP.  Goal status: INITIAL  2.  Active right shoulder flexion to 145 degrees so the patient can easily reach overhead.  Goal status: INITIAL  3.  Active ER to 70 degrees+ to allow for easily donning/doffing of apparel.  Goal status: INITIAL  4.  Increase shoulder strength to a solid 4+/5 to increase stability for performance of functional activities.  Goal status: INITIAL  5.  Perform ADL's with pain not > 3/10.  Goal status: INITIAL  PLAN:  PT FREQUENCY: 2x/week  PT DURATION: 6 weeks  PLANNED INTERVENTIONS: 97110-Therapeutic exercises, 97530- Therapeutic activity, [id number]- Neuromuscular re-education, 97535- Self Care, 02859- Manual therapy, G0283- Electrical stimulation (unattended), 97016- Vasopneumatic device, 97035- Ultrasound, Patient/Family education, and Cryotherapy  PLAN FOR NEXT SESSION: Progress per protocol:  Begin with gentle right shoulder P/AROM.  Vasopneumatic with pillow between thorax and elbow.     Laurelin Elson, PT 04/06/2024, 2:30 PM

## 2024-04-09 ENCOUNTER — Ambulatory Visit: Admitting: Physical Therapy

## 2024-04-09 DIAGNOSIS — G8929 Other chronic pain: Secondary | ICD-10-CM

## 2024-04-09 DIAGNOSIS — M25511 Pain in right shoulder: Secondary | ICD-10-CM | POA: Diagnosis not present

## 2024-04-09 DIAGNOSIS — M25611 Stiffness of right shoulder, not elsewhere classified: Secondary | ICD-10-CM

## 2024-04-09 NOTE — Therapy (Addendum)
 OUTPATIENT PHYSICAL THERAPY SHOULDER TREATMENT  Patient Name: [name] MRN: [id number] DOB:05/16/1950, 73 y.o., female Today's Date: 04/09/2024  END OF SESSION:  PT End of Session - 04/09/24 1553     Visit Number 2    Number of Visits 12    Date for Recertification  05/18/24    PT Start Time 0305    PT Stop Time 0351    PT Time Calculation (min) 46 min    Activity Tolerance Patient tolerated treatment well    Behavior During Therapy WFL for tasks assessed/performed           Past Medical History:  Diagnosis Date   Anxiety    hx panic attacks;  no meds   Arthritis NECK AND SHOULDERS   Chronic facial pain SECONDARY TO NERVE PALSY--  TAKES GABAPENTIN    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   Frequency of urination    GERD (gastroesophageal reflux disease)    H/O hiatal hernia    History of kidney stones    Interstitial cystitis    Nocturia    PONV (postoperative nausea and vomiting)    Urgency of urination    Past Surgical History:  Procedure Laterality Date   ABDOMINAL HYSTERECTOMY  1987   AND BURCH PROCEDURE  (BLADDER SUSPENSION)   ATTEMPTED URETEROSCOPIC STONE EXTRACTION  2003   BLADDER SLING PROCEDURE  2005   BREAST REDUCTION SURGERY  2009   BILATERAL   BUNIONECTOMY  SEPT 2005   LEFT FOOT   EXTRACORPOREAL SHOCK WAVE LITHOTRIPSY  2002   RIGHT SIDE   LAPAROSCOPIC LYSIS OF ADHESIONS  03/03/2012   Procedure: LAPAROSCOPIC LYSIS OF ADHESIONS;  Surgeon: Alm JAYSON Cook, MD;  Location: [facility] [place];  Service: Gynecology;  Laterality: N/A;   LAPAROSCOPY  03/03/2012   Procedure: LAPAROSCOPY OPERATIVE;  Surgeon: Alm JAYSON Cook, MD;  Location: [facility];  Service: Gynecology;  Laterality: N/A;   NASAL SINUS SURGERY  DEC 2009   PUBOVAGINAL SLING  04/05/2011   Procedure: CARLOYN GLADE;  Surgeon: Arlena LILLETTE Gal, MD;  Location: [facility];  Service: Urology;  Laterality: N/A;  ANTERIOR UPHOLD LITE   AND  CYSTOSCOPY   RECTOCELE REPAIR N/A 08/18/2012   Procedure: REPAIR POSTERIOR VAGINAL WALL SCAR AND VAGINOPLASTY ;  Surgeon: Arlena LILLETTE Gal, MD;  Location: [facility] [place];  Service: Urology;  Laterality: N/A;   RIGHT SHOULDER SURGERY  DEC 2005   TUBAL LIGATION  1980   VAGINAL PROLAPSE REPAIR  03/03/2012   Procedure: VAGINAL VAULT SUSPENSION;  Surgeon: Arlena LILLETTE Gal, MD;  Location: [facility];  Service: Urology;  Laterality: N/A;  repair with xenform with ileococcygeus repair   Patient Active Problem List   Diagnosis Date Noted   SOB (shortness of breath) 11/04/2014   Constipation 01/08/2011   REFERRING PROVIDER: Elspeth Her MD  REFERRING DIAG: Tear of RTC s/p surgery.    THERAPY DIAG:  No diagnosis found.  Rationale for Evaluation and Treatment: Rehabilitation  ONSET DATE: Surgery date (04/01/24).    SUBJECTIVE:  SUBJECTIVE STATEMENT: Doing better today.  Cervical dystonia.  OP.  PAIN:  Are you having pain? Yes: NPRS scale: 7/10.   Pain location: Right shoulder. Pain description: Throbbing and sore.   Aggravating factors: Hanging down.   Relieving factors: Elevate  PRECAUTIONS: Other: Progress per protocol.    RED FLAGS: None   WEIGHT BEARING RESTRICTIONS: No right UE weight bearing.  FALLS:  Has patient fallen in last 6 months? Yes. Number of falls 1.  LIVING ENVIRONMENT: Lives with: lives with their family Lives in: House/apartment Has following equipment at home: None  OCCUPATION: Retired.  PLOF: Independent  PATIENT GOALS:  Use right shoulder without pain.    NEXT MD VISIT:   OBJECTIVE:   PATIENT SURVEYS:  QUICKDASH:  48 points (84%).     UPPER EXTREMITY ROM:   In supine:  Gentle P/AROM flexion is 80 degrees and ER is 30  degrees.   PALPATION:  The patient c/o diffuse right shoulder pain.  Post-surgical dressing removed and steri-strips intact.  Ecchymosis present as expected.                                                                                                                               TREATMENT DATE: 04/09/24:  In supine:  Gentle P/AROM into right shoulder flexion and ER x 23 minutes f/n vasopneumatic x 15 minutes on low with pillow between right elbow and thorax.     PATIENT EDUCATION: Education details: See below. Person educated: Patient and Caregiver   Education method: Medical Illustrator Education comprehension: verbalized understanding, handout  HOME EXERCISE PROGRAM: CANE CANE Created by Devine Klingel Dec 11th, 2025 View at [email or web address] code [id number] WAND EXTERNAL ROTATION - SUPINE ER Lie on your back holding a cane or wand with both hands. On the affected side, place a small rolled up towel or pillow under your elbow. Maintain approx. 90 degree bend at the elbow with your arm approximately 30-45 degrees away from your side. GENTLE. PAIN-FREE Use your other arm to pull the wand/cane to rotate the affected arm back into a stretch. Hold and then return to starting position and then repeat. **This can be done with your elbow on the armrest of your recliner** Repeat 10 Times Hold 10 Seconds Complete 2 Sets Perform 4 Times a Day  ASSESSMENT:  CLINICAL IMPRESSION: Patient tolerated gentle right shoulder P/AROM without compliant today.  Added cane ER for today with handout and instruct to patient and caregiver.    OBJECTIVE IMPAIRMENTS: decreased activity tolerance, decreased ROM, and pain.   ACTIVITY LIMITATIONS: carrying, lifting, bending, bathing, dressing, and reach over head  PARTICIPATION LIMITATIONS: meal prep, cleaning, and laundry  PERSONAL FACTORS: Time since onset of injury/illness/exacerbation and 1 comorbidity: cervical dystonia are also  affecting patient's functional outcome.   REHAB POTENTIAL: Good  CLINICAL DECISION MAKING: Stable/uncomplicated  EVALUATION COMPLEXITY: Low   GOALS:  SHORT TERM GOALS: Target date: 04/20/24.  Ind with an initial HEP.  Goal status: INITIAL   LONG TERM GOALS: Target date: 07/05/24.  Ind with an advanced HEP.  Goal status: INITIAL  2.  Active right shoulder flexion to 145 degrees so the patient can easily reach overhead.  Goal status: INITIAL  3.  Active ER to 70 degrees+ to allow for easily donning/doffing of apparel.  Goal status: INITIAL  4.  Increase shoulder strength to a solid 4+/5 to increase stability for performance of functional activities.  Goal status: INITIAL  5.  Perform ADL's with pain not > 3/10.  Goal status: INITIAL  PLAN:  PT FREQUENCY: 2x/week  PT DURATION: 6 weeks  PLANNED INTERVENTIONS: 97110-Therapeutic exercises, 97530- Therapeutic activity, [id number]- Neuromuscular re-education, 97535- Self Care, 02859- Manual therapy, G0283- Electrical stimulation (unattended), 97016- Vasopneumatic device, 97035- Ultrasound, Patient/Family education, and Cryotherapy  PLAN FOR NEXT SESSION: Progress per protocol:  Begin with gentle right shoulder P/AROM.  Vasopneumatic with pillow between thorax and elbow.     Jaley Yan, PT 04/09/2024, 3:54 PM

## 2024-04-10 ENCOUNTER — Encounter (HOSPITAL_COMMUNITY): Payer: Self-pay

## 2024-04-10 ENCOUNTER — Other Ambulatory Visit: Payer: Self-pay

## 2024-04-10 ENCOUNTER — Emergency Department (HOSPITAL_COMMUNITY)
Admission: EM | Admit: 2024-04-10 | Discharge: 2024-04-10 | Disposition: A | Discharge status: Home / Self Care | Attending: Emergency Medicine | Admitting: Emergency Medicine

## 2024-04-10 DIAGNOSIS — Z5321 Procedure and treatment not carried out due to patient leaving prior to being seen by health care provider: Secondary | ICD-10-CM | POA: Insufficient documentation

## 2024-04-10 DIAGNOSIS — R0789 Other chest pain: Secondary | ICD-10-CM | POA: Diagnosis not present

## 2024-04-10 LAB — CBC WITH DIFFERENTIAL/PLATELET
Abs Immature Granulocytes: 0.04 K/uL (ref 0.00–0.07)
Basophils Absolute: 0.1 K/uL (ref 0.0–0.1)
Basophils Relative: 1 %
Eosinophils Absolute: 0.1 K/uL (ref 0.0–0.5)
Eosinophils Relative: 2 %
HCT: 36.3 % (ref 36.0–46.0)
Hemoglobin: 12.1 g/dL (ref 12.0–15.0)
Immature Granulocytes: 1 %
Lymphocytes Relative: 17 %
Lymphs Abs: 1.2 K/uL (ref 0.7–4.0)
MCH: 30.8 pg (ref 26.0–34.0)
MCHC: 33.3 g/dL (ref 30.0–36.0)
MCV: 92.4 fL (ref 80.0–100.0)
Monocytes Absolute: 0.5 K/uL (ref 0.1–1.0)
Monocytes Relative: 7 %
Neutro Abs: 4.9 K/uL (ref 1.7–7.7)
Neutrophils Relative %: 72 %
Platelets: 319 K/uL (ref 150–400)
RBC: 3.93 MIL/uL (ref 3.87–5.11)
RDW: 12.3 % (ref 11.5–15.5)
WBC: 6.9 K/uL (ref 4.0–10.5)
nRBC: 0 % (ref 0.0–0.2)

## 2024-04-10 LAB — COMPREHENSIVE METABOLIC PANEL WITH GFR
ALT: 14 U/L (ref 0–44)
AST: 17 U/L (ref 15–41)
Albumin: 3 g/dL — ABNORMAL LOW (ref 3.5–5.0)
Alkaline Phosphatase: 63 U/L (ref 38–126)
Anion gap: 9 (ref 5–15)
BUN: 17 mg/dL (ref 8–23)
CO2: 29 mmol/L (ref 22–32)
Calcium: 8.6 mg/dL — ABNORMAL LOW (ref 8.9–10.3)
Chloride: 101 mmol/L (ref 98–111)
Creatinine, Ser: 0.81 mg/dL (ref 0.44–1.00)
GFR, Estimated: >60 mL/min (ref >60)
Glucose, Bld: 93 mg/dL (ref 70–99)
Potassium: 4.1 mmol/L (ref 3.5–5.1)
Sodium: 139 mmol/L (ref 135–145)
Total Bilirubin: 0.3 mg/dL (ref 0.0–1.2)
Total Protein: 6.4 g/dL — ABNORMAL LOW (ref 6.5–8.1)

## 2024-04-10 LAB — TROPONIN I (HIGH SENSITIVITY): Troponin I (High Sensitivity): 3 ng/L (ref <18)

## 2024-04-10 NOTE — ED Notes (Signed)
 Last call for vitals check no response.SABRASABRASABRA

## 2024-04-10 NOTE — ED Notes (Signed)
 Called PT three times and no response.SABRASABRASABRA

## 2024-04-10 NOTE — ED Provider Triage Note (Signed)
 Emergency Medicine Provider Triage Evaluation Note  [name] , a 73 y.o. female  was evaluated in triage.  Pt complains of this patient is a 73 year old female presenting with a complaint of some left-sided chest heaviness and squeezing, feels like her heart is going to start racing but it has not started racing it.  She has no history of cardiac disease either arrhythmia or ischemia or obstructive disease.  She takes medications including gabapentin , she has a history of cervical dystonia and trigeminal neuralgia.  She recently had a right shoulder surgery for rotator cuff repair.  Her symptoms started this morning.  Review of Systems  Positive: Chest discomfort Negative: Fevers chills nausea vomiting or swelling of the legs  Physical Exam  BP 135/72   Pulse 65   Temp 98.2 F (36.8 C)   Resp 17   SpO2 94%  Gen:   Awake, no distress   Resp:  Normal effort MSK:   Moves extremities without difficulty no edema Other:  Cardiac exam is unremarkable, occasional PAC, no murmurs  Medical Decision Making  Medically screening exam initiated at 10:04 AM.  Appropriate orders placed.  [name] was informed that the remainder of the evaluation will be completed by another provider, this initial triage assessment does not replace that evaluation, and the importance of remaining in the ED until their evaluation is complete.  EKG and labs, will need cardiac workup   Cleotilde Rogue, MD 04/10/24 1005

## 2024-04-10 NOTE — ED Triage Notes (Signed)
 Pt c.o chest pressure ongoing for several months, intermittent, but more frequently in the last week.

## 2024-04-10 NOTE — ED Notes (Signed)
 Urine sample sent to the main lab.

## 2024-04-13 ENCOUNTER — Ambulatory Visit

## 2024-04-13 DIAGNOSIS — M25511 Pain in right shoulder: Secondary | ICD-10-CM

## 2024-04-13 DIAGNOSIS — G8929 Other chronic pain: Secondary | ICD-10-CM

## 2024-04-13 DIAGNOSIS — M25611 Stiffness of right shoulder, not elsewhere classified: Secondary | ICD-10-CM

## 2024-04-13 DIAGNOSIS — M62838 Other muscle spasm: Secondary | ICD-10-CM

## 2024-04-13 NOTE — Therapy (Signed)
 OUTPATIENT PHYSICAL THERAPY SHOULDER TREATMENT  Patient Name: [name] MRN: [id number] DOB:August 13, 1950, 73 y.o., female Today's Date: 04/13/2024  END OF SESSION:  PT End of Session - 04/13/24 1545     Visit Number 3    Number of Visits 12    Date for Recertification  05/18/24    PT Start Time 1515    PT Stop Time 1602    PT Time Calculation (min) 47 min    Activity Tolerance Patient tolerated treatment well    Behavior During Therapy WFL for tasks assessed/performed           Past Medical History:  Diagnosis Date   Anxiety    hx panic attacks;  no meds   Arthritis NECK AND SHOULDERS   Chronic facial pain SECONDARY TO NERVE PALSY--  TAKES GABAPENTIN    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   Frequency of urination    GERD (gastroesophageal reflux disease)    H/O hiatal hernia    History of kidney stones    Interstitial cystitis    Nocturia    PONV (postoperative nausea and vomiting)    Urgency of urination    Past Surgical History:  Procedure Laterality Date   ABDOMINAL HYSTERECTOMY  1987   AND BURCH PROCEDURE  (BLADDER SUSPENSION)   ATTEMPTED URETEROSCOPIC STONE EXTRACTION  2003   BLADDER SLING PROCEDURE  2005   BREAST REDUCTION SURGERY  2009   BILATERAL   BUNIONECTOMY  SEPT 2005   LEFT FOOT   EXTRACORPOREAL SHOCK WAVE LITHOTRIPSY  2002   RIGHT SIDE   LAPAROSCOPIC LYSIS OF ADHESIONS  03/03/2012   Procedure: LAPAROSCOPIC LYSIS OF ADHESIONS;  Surgeon: Alm JAYSON Cook, MD;  Location: [facility];  Service: Gynecology;  Laterality: N/A;   LAPAROSCOPY  03/03/2012   Procedure: LAPAROSCOPY OPERATIVE;  Surgeon: Alm JAYSON Cook, MD;  Location: [facility];  Service: Gynecology;  Laterality: N/A;   NASAL SINUS SURGERY  DEC 2009   PUBOVAGINAL SLING  04/05/2011   Procedure: CARLOYN GLADE;  Surgeon: Arlena LILLETTE Gal, MD;  Location: [facility];  Service: Urology;  Laterality: N/A;  ANTERIOR UPHOLD LITE   AND  CYSTOSCOPY   RECTOCELE REPAIR N/A 08/18/2012   Procedure: REPAIR POSTERIOR VAGINAL WALL SCAR AND VAGINOPLASTY ;  Surgeon: Arlena LILLETTE Gal, MD;  Location: [facility];  Service: Urology;  Laterality: N/A;   RIGHT SHOULDER SURGERY  DEC 2005   TUBAL LIGATION  1980   VAGINAL PROLAPSE REPAIR  03/03/2012   Procedure: VAGINAL VAULT SUSPENSION;  Surgeon: Arlena LILLETTE Gal, MD;  Location: [facility];  Service: Urology;  Laterality: N/A;  repair with xenform with ileococcygeus repair   Patient Active Problem List   Diagnosis Date Noted   SOB (shortness of breath) 11/04/2014   Constipation 01/08/2011   REFERRING PROVIDER: Elspeth Her MD  REFERRING DIAG: Tear of RTC s/p surgery.    THERAPY DIAG:  Chronic right shoulder pain  Stiffness of right shoulder, not elsewhere classified  Other muscle spasm  Acute pain of right shoulder  Rationale for Evaluation and Treatment: Rehabilitation  ONSET DATE: Surgery date (04/01/24).    SUBJECTIVE:  SUBJECTIVE STATEMENT: Pt reports 5/10 right shoulder pain.  Pt reports performing exercises at home.  Cervical dystonia.  OP.  PAIN:  Are you having pain? Yes: NPRS scale: 5/10.   Pain location: Right shoulder. Pain description: Throbbing and sore.   Aggravating factors: Hanging down.   Relieving factors: Elevate  PRECAUTIONS: Other: Progress per protocol.    RED FLAGS: None   WEIGHT BEARING RESTRICTIONS: No right UE weight bearing.  FALLS:  Has patient fallen in last 6 months? Yes. Number of falls 1.  LIVING ENVIRONMENT: Lives with: lives with their family Lives in: House/apartment Has following equipment at home: None  OCCUPATION: Retired.  PLOF: Independent  PATIENT GOALS:  Use right shoulder without pain.    NEXT  MD VISIT:   OBJECTIVE:   PATIENT SURVEYS:  QUICKDASH:  48 points (84%).     UPPER EXTREMITY ROM:   In supine:  Gentle P/AROM flexion is 80 degrees and ER is 30 degrees.   PALPATION:  The patient c/o diffuse right shoulder pain.  Post-surgical dressing removed and steri-strips intact.  Ecchymosis present as expected.                                                                                                                               TREATMENT DATE: 1  04/13/24:  In supine:  Gentle P/AROM into right shoulder flexion and ER x 23 minutes f/n vasopneumatic x 15 minutes on low with pillow between right elbow and thorax.     PATIENT EDUCATION: Education details: See below. Person educated: Patient and Caregiver   Education method: Medical Illustrator Education comprehension: verbalized understanding, handout  HOME EXERCISE PROGRAM: CANE CANE Created by Chad Applegate Dec 11th, 2025 View at [email or web address] code [id number] WAND EXTERNAL ROTATION - SUPINE ER Lie on your back holding a cane or wand with both hands. On the affected side, place a small rolled up towel or pillow under your elbow. Maintain approx. 90 degree bend at the elbow with your arm approximately 30-45 degrees away from your side. GENTLE. PAIN-FREE Use your other arm to pull the wand/cane to rotate the affected arm back into a stretch. Hold and then return to starting position and then repeat. **This can be done with your elbow on the armrest of your recliner** Repeat 10 Times Hold 10 Seconds Complete 2 Sets Perform 4 Times a Day  ASSESSMENT:  CLINICAL IMPRESSION: Pt arrives for today's treatment session reporting 5/10 right shoulder pain.  Pt reports that she is performing HEP at home 2 to 3 times daily.  Pt able to tolerate P/AROM today well.  Normal responses to vaso noted upon removal.  Pt reported decreased pain at completion of today's treatment session.   OBJECTIVE IMPAIRMENTS:  decreased activity tolerance, decreased ROM, and pain.   ACTIVITY LIMITATIONS: carrying, lifting, bending, bathing, dressing, and reach over head  PARTICIPATION LIMITATIONS: meal prep, cleaning, and laundry  PERSONAL FACTORS: Time since onset of injury/illness/exacerbation  and 1 comorbidity: cervical dystonia are also affecting patient's functional outcome.   REHAB POTENTIAL: Good  CLINICAL DECISION MAKING: Stable/uncomplicated  EVALUATION COMPLEXITY: Low   GOALS:  SHORT TERM GOALS: Target date: 04/20/24.  Ind with an initial HEP. Goal status: INITIAL   LONG TERM GOALS: Target date: 07/05/24.  Ind with an advanced HEP.  Goal status: INITIAL  2.  Active right shoulder flexion to 145 degrees so the patient can easily reach overhead.  Goal status: INITIAL  3.  Active ER to 70 degrees+ to allow for easily donning/doffing of apparel.  Goal status: INITIAL  4.  Increase shoulder strength to a solid 4+/5 to increase stability for performance of functional activities.  Goal status: INITIAL  5.  Perform ADL's with pain not > 3/10.  Goal status: INITIAL  PLAN:  PT FREQUENCY: 2x/week  PT DURATION: 6 weeks  PLANNED INTERVENTIONS: 97110-Therapeutic exercises, 97530- Therapeutic activity, [id number]- Neuromuscular re-education, 97535- Self Care, 02859- Manual therapy, [id number]- Electrical stimulation (unattended), 97016- Vasopneumatic device, 97035- Ultrasound, Patient/Family education, and Cryotherapy  PLAN FOR NEXT SESSION: Progress per protocol:  Begin with gentle right shoulder P/AROM.  Vasopneumatic with pillow between thorax and elbow.     Delon DELENA Gosling, PTA 04/13/2024, 4:22 PM

## 2024-04-16 ENCOUNTER — Ambulatory Visit: Admitting: Physical Therapy

## 2024-04-16 DIAGNOSIS — G8929 Other chronic pain: Secondary | ICD-10-CM

## 2024-04-16 DIAGNOSIS — M25511 Pain in right shoulder: Secondary | ICD-10-CM | POA: Diagnosis not present

## 2024-04-16 DIAGNOSIS — M25611 Stiffness of right shoulder, not elsewhere classified: Secondary | ICD-10-CM

## 2024-04-16 NOTE — Therapy (Signed)
 OUTPATIENT PHYSICAL THERAPY SHOULDER TREATMENT  Patient Name: [name] MRN: [id number] DOB:1951-01-22, 73 y.o., female Today's Date: 04/16/2024  END OF SESSION:  PT End of Session - 04/16/24 1803     Visit Number 4    Number of Visits 12    Date for Recertification  05/18/24    PT Start Time 0445    PT Stop Time 0552    PT Time Calculation (min) 67 min    Activity Tolerance Patient tolerated treatment well    Behavior During Therapy WFL for tasks assessed/performed           Past Medical History:  Diagnosis Date   Anxiety    hx panic attacks;  no meds   Arthritis NECK AND SHOULDERS   Chronic facial pain SECONDARY TO NERVE PALSY--  TAKES GABAPENTIN    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   Frequency of urination    GERD (gastroesophageal reflux disease)    H/O hiatal hernia    History of kidney stones    Interstitial cystitis    Nocturia    PONV (postoperative nausea and vomiting)    Urgency of urination    Past Surgical History:  Procedure Laterality Date   ABDOMINAL HYSTERECTOMY  1987   AND BURCH PROCEDURE  (BLADDER SUSPENSION)   ATTEMPTED URETEROSCOPIC STONE EXTRACTION  2003   BLADDER SLING PROCEDURE  2005   BREAST REDUCTION SURGERY  2009   BILATERAL   BUNIONECTOMY  SEPT 2005   LEFT FOOT   EXTRACORPOREAL SHOCK WAVE LITHOTRIPSY  2002   RIGHT SIDE   LAPAROSCOPIC LYSIS OF ADHESIONS  03/03/2012   Procedure: LAPAROSCOPIC LYSIS OF ADHESIONS;  Surgeon: Alm JAYSON Cook, MD;  Location: [facility];  Service: Gynecology;  Laterality: N/A;   LAPAROSCOPY  03/03/2012   Procedure: LAPAROSCOPY OPERATIVE;  Surgeon: Alm JAYSON Cook, MD;  Location: [facility];  Service: Gynecology;  Laterality: N/A;   NASAL SINUS SURGERY  DEC 2009   PUBOVAGINAL SLING  04/05/2011   Procedure: CARLOYN GLADE;  Surgeon: Arlena LILLETTE Gal, MD;  Location: [facility];  Service: Urology;  Laterality: N/A;  ANTERIOR UPHOLD LITE   AND  CYSTOSCOPY   RECTOCELE REPAIR N/A 08/18/2012   Procedure: REPAIR POSTERIOR VAGINAL WALL SCAR AND VAGINOPLASTY ;  Surgeon: Arlena LILLETTE Gal, MD;  Location: [facility];  Service: Urology;  Laterality: N/A;   RIGHT SHOULDER SURGERY  DEC 2005   TUBAL LIGATION  1980   VAGINAL PROLAPSE REPAIR  03/03/2012   Procedure: VAGINAL VAULT SUSPENSION;  Surgeon: Arlena LILLETTE Gal, MD;  Location: [facility];  Service: Urology;  Laterality: N/A;  repair with xenform with ileococcygeus repair   Patient Active Problem List   Diagnosis Date Noted   SOB (shortness of breath) 11/04/2014   Constipation 01/08/2011   REFERRING PROVIDER: Elspeth Her MD  REFERRING DIAG: Tear of RTC s/p surgery.    THERAPY DIAG:  Chronic right shoulder pain  Stiffness of right shoulder, not elsewhere classified  Rationale for Evaluation and Treatment: Rehabilitation  ONSET DATE: Surgery date (04/01/24).    SUBJECTIVE:  SUBJECTIVE STATEMENT: Dr. was very pleased.  To remain in sling at this time.    Cervical dystonia.  OP.  PAIN:  Are you having pain? Yes: NPRS scale: 5/10.   Pain location: Right shoulder. Pain description: Throbbing and sore.   Aggravating factors: Hanging down.   Relieving factors: Elevate  PRECAUTIONS: Other: Progress per protocol.    RED FLAGS: None   WEIGHT BEARING RESTRICTIONS: No right UE weight bearing.  FALLS:  Has patient fallen in last 6 months? Yes. Number of falls 1.  LIVING ENVIRONMENT: Lives with: lives with their family Lives in: House/apartment Has following equipment at home: None  OCCUPATION: Retired.  PLOF: Independent  PATIENT GOALS:  Use right shoulder without pain.    NEXT MD VISIT:   OBJECTIVE:   PATIENT SURVEYS:  QUICKDASH:  48 points (84%).      UPPER EXTREMITY ROM:   In supine:  Gentle P/AROM flexion is 80 degrees and ER is 30 degrees.   PALPATION:  The patient c/o diffuse right shoulder pain.  Post-surgical dressing removed and steri-strips intact.  Ecchymosis present as expected.                                                                                                                               TREATMENT DATE: 04/26/24:  In supine:  Gentle P/AROM into right shoulder flexion and ER x 38 minutes f/b vasopneumatic x 15 minutes on low with pillow between right elbow and thorax.    04/13/24:  In supine:  Gentle P/AROM into right shoulder flexion and ER x 23 minutes f/b vasopneumatic x 15 minutes on low with pillow between right elbow and thorax.     PATIENT EDUCATION: Education details: See below. Person educated: Patient and Caregiver   Education method: Medical Illustrator Education comprehension: verbalized understanding, handout  HOME EXERCISE PROGRAM: CANE CANE Created by Barret Esquivel Dec 11th, 2025 View at [email or web address] code [id number] WAND EXTERNAL ROTATION - SUPINE ER Lie on your back holding a cane or wand with both hands. On the affected side, place a small rolled up towel or pillow under your elbow. Maintain approx. 90 degree bend at the elbow with your arm approximately 30-45 degrees away from your side. GENTLE. PAIN-FREE Use your other arm to pull the wand/cane to rotate the affected arm back into a stretch. Hold and then return to starting position and then repeat. **This can be done with your elbow on the armrest of your recliner** Repeat 10 Times Hold 10 Seconds Complete 2 Sets Perform 4 Times a Day  ASSESSMENT:  CLINICAL IMPRESSION: Patient back for MD visit.  Steri-strips removed.  She to to continue wearing the sling at this time.  She tolerated treatment without complaint today.    OBJECTIVE IMPAIRMENTS: decreased activity tolerance, decreased ROM, and pain.    ACTIVITY LIMITATIONS: carrying, lifting, bending, bathing, dressing, and reach over head  PARTICIPATION LIMITATIONS: meal prep, cleaning, and laundry  PERSONAL  FACTORS: Time since onset of injury/illness/exacerbation and 1 comorbidity: cervical dystonia are also affecting patient's functional outcome.   REHAB POTENTIAL: Good  CLINICAL DECISION MAKING: Stable/uncomplicated  EVALUATION COMPLEXITY: Low   GOALS:  SHORT TERM GOALS: Target date: 04/20/24.  Ind with an initial HEP. Goal status: INITIAL   LONG TERM GOALS: Target date: 07/05/24.  Ind with an advanced HEP.  Goal status: INITIAL  2.  Active right shoulder flexion to 145 degrees so the patient can easily reach overhead.  Goal status: INITIAL  3.  Active ER to 70 degrees+ to allow for easily donning/doffing of apparel.  Goal status: INITIAL  4.  Increase shoulder strength to a solid 4+/5 to increase stability for performance of functional activities.  Goal status: INITIAL  5.  Perform ADL's with pain not > 3/10.  Goal status: INITIAL  PLAN:  PT FREQUENCY: 2x/week  PT DURATION: 6 weeks  PLANNED INTERVENTIONS: 97110-Therapeutic exercises, 97530- Therapeutic activity, [id number]- Neuromuscular re-education, 97535- Self Care, 02859- Manual therapy, G0283- Electrical stimulation (unattended), 97016- Vasopneumatic device, 97035- Ultrasound, Patient/Family education, and Cryotherapy  PLAN FOR NEXT SESSION: Progress per protocol:  Begin with gentle right shoulder P/AROM.  Vasopneumatic with pillow between thorax and elbow.     Shatana Saxton, PT 04/16/2024, 6:04 PM

## 2024-04-17 ENCOUNTER — Encounter: Payer: Self-pay | Admitting: Cardiovascular Disease

## 2024-04-17 ENCOUNTER — Ambulatory Visit: Attending: Cardiovascular Disease | Admitting: Cardiovascular Disease

## 2024-04-17 ENCOUNTER — Ambulatory Visit

## 2024-04-17 VITALS — BP 120/64 | HR 66 | Ht 63.0 in | Wt 132.0 lb

## 2024-04-17 DIAGNOSIS — R0789 Other chest pain: Secondary | ICD-10-CM | POA: Diagnosis not present

## 2024-04-17 DIAGNOSIS — R002 Palpitations: Secondary | ICD-10-CM

## 2024-04-17 DIAGNOSIS — R0602 Shortness of breath: Secondary | ICD-10-CM | POA: Diagnosis not present

## 2024-04-17 NOTE — Assessment & Plan Note (Addendum)
 Benign exam.  Recommend 2D echo to evaluate for any structural heart abnormality, pulmonary hypertension, or other pertinent findings.  Lung exam normal.

## 2024-04-17 NOTE — Progress Notes (Unsigned)
 Enrolled patient for a 7 day Zio XT monitor to mailed to patients home

## 2024-04-17 NOTE — Patient Instructions (Signed)
 Medication Instructions:  No medication changes were made at this visit. Continue current regimen.   *If you need a refill on your cardiac medications before your next appointment, please call your pharmacy*  Lab Work: None ordered today. If you have labs (blood work) drawn today and your tests are completely normal, you will receive your results only by: MyChart Message (if you have MyChart) OR A paper copy in the mail If you have any lab test that is abnormal or we need to change your treatment, we will call you to review the results.  Testing/Procedures: Your physician has requested that you wear a Zio heart monitor for 7 days. This will be mailed to your home with instructions on how to apply the monitor and how to return it when finished. Please allow 2 weeks after returning the heart monitor before our office calls you with the results.   Your physician has requested that you have a lexiscan myoview. For further information please visit [email or web address] Please follow instruction sheet, as given.  Your physician has requested that you have an echocardiogram. Echocardiography is a painless test that uses sound waves to create images of your heart. It provides your doctor with information about the size and shape of your heart and how well your hearts chambers and valves are working. This procedure takes approximately one hour. There are no restrictions for this procedure. Please do NOT wear cologne, perfume, aftershave, or lotions (deodorant is allowed). Please arrive 15 minutes prior to your appointment time.  Please note: We ask at that you not bring children with you during ultrasound (echo/ vascular) testing. Due to room size and safety concerns, children are not allowed in the ultrasound rooms during exams. Our front office staff cannot provide observation of children in our lobby area while testing is being conducted. An adult accompanying a patient to their appointment will only  be allowed in the ultrasound room at the discretion of the ultrasound technician under special circumstances. We apologize for any inconvenience.   Follow-Up: At [facility], you and your health needs are our priority.  As part of our continuing mission to provide you with exceptional heart care, our providers are all part of one team.  This team includes your primary Cardiologist (physician) and Advanced Practice Providers or APPs (Physician Assistants and Nurse Practitioners) who all work together to provide you with the care you need, when you need it.  Your next appointment:   1 year(s)  Provider:   Ozell Fell, MD    We recommend signing up for the patient portal called MyChart.  Sign up information is provided on this After Visit Summary.  MyChart is used to connect with patients for Virtual Visits (Telemedicine).  Patients are able to view lab/test results, encounter notes, upcoming appointments, etc.  Non-urgent messages can be sent to your provider as well.   To learn more about what you can do with MyChart, go to [email or web address].   Other Instructions Lexiscan Myoview (Stress Test) Instructions  Please arrive 15 minutes prior to your appointment time for registration and insurance purposes.   The test will take approximately 3 to 4 hours to complete; you may bring reading material.  If someone comes with you to your appointment, they will need to remain in the main lobby due to limited space in the testing area. **If you are pregnant or breastfeeding, please notify the nuclear lab prior to your appointment**   How to prepare for your Myocardial Perfusion Test: Do  not eat or drink 3 hours prior to your test, except you may have water . Do not consume products containing caffeine (regular or decaffeinated) 12 hours prior to your test. (ex: coffee, chocolate, sodas, tea). Do bring a list of your current medications with you.   Do wear comfortable clothes (no  dresses or overalls) and walking shoes, tennis shoes preferred (No heels or open toe shoes are allowed). Do NOT wear cologne, perfume, aftershave, or lotions (deodorant is allowed). If these instructions are not followed, your test will have to be rescheduled.   Please report to [address] [place], [place] [address] for your test.  If you have questions or concerns about your appointment, you can call the Nuclear Lab at [phone].   If you cannot keep your appointment, please provide 24 hours notification to the Nuclear Lab, to avoid a possible $50 charge to your account. ------------------------------------------------------------------------------------------------------  ZIO XT- Long Term Monitor Instructions  Your physician has requested you wear a ZIO patch monitor for 7 days.   This is a single patch monitor. [employer] supplies one patch monitor per enrollment. Additional  stickers are not available. Please do not apply patch if you will be having a Nuclear Stress Test,  Echocardiogram, Cardiac CT, MRI, or Chest Xray during the period you would be wearing the  monitor. The patch cannot be worn during these tests. You cannot remove and re-apply the  ZIO XT patch monitor.   Your ZIO patch monitor will be mailed 3 day USPS to your address on file. It may take 3-5 days  to receive your monitor after you have been enrolled.   Once you have received your monitor, please review the enclosed instructions. Your monitor  has already been registered assigning a specific monitor serial # to you.     Billing and Patient Assistance Program Information  We have supplied [employer] with any of your insurance information on file for billing purposes.  [employer] offers a sliding scale Patient Assistance Program for patients that do not have  insurance, or whose insurance does not completely cover the cost of the ZIO monitor.  You must apply for the Patient Assistance Program to qualify for this  discounted rate.   To apply, please call [employer] at [phone], select option 4, select option 2, ask to apply for  Patient Assistance Program. Meredeth will ask your household income, and how many people  are in your household. They will quote your out-of-pocket cost based on that information.  [employer] will also be able to set up a 11-month, interest-free payment plan if needed.     Applying the monitor  Shave hair from upper left chest.  Hold abrader disc by orange tab. Rub abrader in 40 strokes over the upper left chest as  indicated in your monitor instructions.  Clean area with 4 enclosed alcohol  pads. Let dry.  Apply patch as indicated in monitor instructions. Patch will be placed under collarbone on left  side of chest with arrow pointing upward.  Rub patch adhesive wings for 2 minutes. Remove white label marked 1. Remove the white  label marked 2. Rub patch adhesive wings for 2 additional minutes.  While looking in a mirror, press and release button in center of patch. A small green light will  flash 3-4 times. This will be your only indicator that the monitor has been turned on.  Do not shower for the first 24 hours. You may shower after the first 24 hours.  Press the button if  you feel a symptom. You will hear a small click. Record Date, Time and  Symptom in the Patient Logbook.  When you are ready to remove the patch, follow instructions on the last 2 pages of Patient  Logbook. Stick patch monitor onto the last page of Patient Logbook.   Place Patient Logbook in the blue and white box. Use locking tab on box and tape box closed  securely. The blue and white box has prepaid postage on it. Please place it in the mailbox as  soon as possible. Your physician should have your test results approximately 7 days after the  monitor has been mailed back to [facility].   Call [facility] Customer Care at [phone] if you have questions regarding  your ZIO XT patch  monitor. Call them immediately if you see an orange light blinking on your  monitor.   If your monitor falls off in less than 4 days, contact our Monitor department at [phone].   If your monitor becomes loose or falls off after 4 days call [employer] at [phone] for  suggestions on securing your monitor.

## 2024-04-17 NOTE — Progress Notes (Signed)
 " Cardiology Office Note:    Date:  04/17/2024   ID:  [name], DOB 12-Dec-1950, MRN [id number]  PCP:  Rosamond Leta NOVAK, MD   [facility] Providers Cardiologist:  Ozell Fell, MD     Referring MD: Rosamond Leta NOVAK, MD   Chief Complaint  Patient presents with   Chest Pain    History of Present Illness:    [name] is a 73 y.o. female presenting for evaluation of chest pain and palpitations. She was recently seen at [facility] ER for chest pressure and had normal troponins, but was never formally evaluated by the ER physician. They report that they were in the ER for about 10 hours.   Past cardiac studies in 2016 showed a normal exercise treadmill test and a normal echo with LVEF 55 to 60% with no regional wall motion abnormalities, mild mitral regurgitation, trivial tricuspid regurgitation, and estimated PA systolic pressure of 21 mmHg.  The patient is here with her 2 daughters today.  The patient has right rotator cuff surgery a few weeks ago but was experiencing palpitations even prior to that. She's had a few episodes of near syncope in the past. More recently, and since her recent surgery, she's had chest pressure, palpitations, and shortness of breath with weakness occurring daily and associated with light physical activity. She's also been having a lot of nausea and has been using zofran  for that.   Current Medications: Active Medications[1]   Allergies:   Milk-related compounds, Bactrim, and Sulfa antibiotics   ROS:   Please see the history of present illness.    All other systems reviewed and are negative.  EKGs/Labs/Other Studies Reviewed:    The following studies were reviewed today: Cardiac Studies & Procedures   ______________________________________________________________________________________________   STRESS TESTS  EXERCISE TOLERANCE TEST (ETT) 12/09/2014  Interpretation Summary  There was no ST segment deviation noted during  stress.  [facility] treadmill score of 6 predicts a low risk of cardiovascular events.   ECHOCARDIOGRAM  ECHOCARDIOGRAM COMPLETE 12/01/2014  Narrative *[facility]* [address] [place], [place] [address] [phone]  ------------------------------------------------------------------- Transthoracic Echocardiography  Patient:    [name], [name] MR #:       [id number] Study Date: 12/01/2014 Gender:     F Age:        78 Height:     160 cm Weight:     61.7 kg BSA:        1.67 m^2 Pt. Status: Room:  SONOGRAPHER  [facility] ATTENDING    Cassondra Ross, M.D. TISA Cassondra Ross, M.D. REFERRING    Cassondra Ross, M.D. PERFORMING   [facility]  cc:  ------------------------------------------------------------------- LV EF: 55% -   60%  ------------------------------------------------------------------- History:   PMH:   Chest pain.  Dyspnea.  Risk factors:  Former tobacco use.  ------------------------------------------------------------------- Study Conclusions  - Left ventricle: The cavity size was normal. Wall thickness was normal. Systolic function was normal. The estimated ejection fraction was in the range of 55% to 60%. Wall motion was normal; there were no regional wall motion abnormalities. Left ventricular diastolic function parameters were normal for the patient&'s age. - Mitral valve: There was mild regurgitation. - Right atrium: Central venous pressure (est): 3 mm Hg. - Tricuspid valve: There was trivial regurgitation. - Pulmonary arteries: PA peak pressure: 21 mm Hg (S). - Pericardium, extracardiac: There was no pericardial effusion.  Impressions:  - Normal LV wall thickness with  LVEF 55-60% and normal diastolic function. Mild mitral regurgitation. Trivial tricuspid regurgitation with PASP estimated 21 mmHg.  Transthoracic echocardiography.  M-mode, complete 2D, spectral Doppler, and color Doppler.  Birthdate:  Patient  birthdate: 08-Apr-1951.  Age:  Patient is 73 yr old.  Sex:  Gender: female. BMI: 24.1 kg/m^2.  Blood pressure:     116/70  Patient status: Outpatient.  Study date:  Study date: 12/01/2014. Study time: 10:57 AM.  -------------------------------------------------------------------  ------------------------------------------------------------------- Left ventricle:  The cavity size was normal. Wall thickness was normal. Systolic function was normal. The estimated ejection fraction was in the range of 55% to 60%. Wall motion was normal; there were no regional wall motion abnormalities. Left ventricular diastolic function parameters were normal for the patient&'s age.  ------------------------------------------------------------------- Aortic valve:   Trileaflet. Cusp separation was normal.  Doppler: There was no significant regurgitation.  ------------------------------------------------------------------- Aorta:  Aortic root: The aortic root was normal in size.  ------------------------------------------------------------------- Mitral valve:   The valve appears to be grossly normal.    Doppler: There was mild regurgitation.    Peak gradient (D): 4 mm Hg.  ------------------------------------------------------------------- Left atrium:  The atrium was normal in size.  ------------------------------------------------------------------- Right ventricle:  The cavity size was normal. Systolic function was normal.  ------------------------------------------------------------------- Pulmonic valve:    The valve appears to be grossly normal. Doppler:  There was trivial regurgitation.  ------------------------------------------------------------------- Tricuspid valve:   The valve appears to be grossly normal. Doppler:  There was trivial regurgitation.  ------------------------------------------------------------------- Right atrium:  The atrium was normal in  size.  ------------------------------------------------------------------- Pericardium:  There was no pericardial effusion.  ------------------------------------------------------------------- Systemic veins: Inferior vena cava: The vessel was normal in size. The respirophasic diameter changes were in the normal range (>= 50%), consistent with normal central venous pressure.  ------------------------------------------------------------------- Measurements  Left ventricle                         Value        Reference LV ID, ED, PLAX chordal                48.3  mm     43 - 52 LV ID, ES, PLAX chordal                34.6  mm     23 - 38 LV fx shortening, PLAX chordal (L)     28    %      >=29 LV PW thickness, ED                    8.22  mm     --------- IVS/LV PW ratio, ED                    0.76         <=1.3 Stroke volume, 2D                      58    ml     --------- Stroke volume/bsa, 2D                  35    ml/m^2 --------- LV e&', lateral                         8.9   cm/s   --------- LV E/e&', lateral  11.8         --------- LV e&', medial                          9.38  cm/s   --------- LV E/e&', medial                        11.19        --------- LV e&', average                         9.14  cm/s   --------- LV E/e&', average                       11.49        ---------  Ventricular septum                     Value        Reference IVS thickness, ED                      6.21  mm     ---------  LVOT                                   Value        Reference LVOT ID, S                             19    mm     --------- LVOT area                              2.84  cm^2   --------- LVOT ID                                19    mm     --------- LVOT peak velocity, S                  93.2  cm/s   --------- LVOT mean velocity, S                  57.2  cm/s   --------- LVOT VTI, S                            20.4  cm     --------- Stroke volume (SV), LVOT DP             57.8  ml     --------- Stroke index (SV/bsa), LVOT DP         34.7  ml/m^2 ---------  Aorta                                  Value        Reference Aortic root ID, ED                     29    mm     ---------  Left atrium  Value        Reference LA ID, A-P, ES                         33    mm     --------- LA ID/bsa, A-P                         1.98  cm/m^2 <=2.2 LA volume, ES, A/L                     43.7  ml     --------- LA volume/bsa, ES, A/L                 26.2  ml/m^2 ---------  Mitral valve                           Value        Reference Mitral E-wave peak velocity            105   cm/s   --------- Mitral A-wave peak velocity            83.2  cm/s   --------- Mitral deceleration time               197   ms     150 - 230 Mitral peak gradient, D                4     mm Hg  --------- Mitral E/A ratio, peak                 1.3          ---------  Pulmonary arteries                     Value        Reference PA pressure, S, DP                     21    mm Hg  <=30  Tricuspid valve                        Value        Reference Tricuspid regurg peak velocity         211   cm/s   --------- Tricuspid peak RV-RA gradient          18    mm Hg  ---------  Systemic veins                         Value        Reference Estimated CVP                          3     mm Hg  ---------  Right ventricle                        Value        Reference TAPSE                                  15.2  mm     --------- RV pressure, S, DP  21    mm Hg  <=30 RV s&', lateral, S                      9.57  cm/s   ---------  Pulmonic valve                         Value        Reference Pulmonic regurg velocity, ED           110   cm/s   ---------  Legend: (L)  and  (H)  mark values outside specified reference range.  ------------------------------------------------------------------- Prepared and Electronically Authenticated by  Jayson Sierras,  M.D. 2016-08-03T11:56:07          ______________________________________________________________________________________________      EKG:   EKG Interpretation Date/Time:  Friday April 17 2024 14:03:36 EST Ventricular Rate:  67 PR Interval:  162 QRS Duration:  80 QT Interval:  384 QTC Calculation: 405 R Axis:   -8  Text Interpretation: Normal sinus rhythm Normal ECG When compared with ECG of 10-Apr-2024 10:06, No significant change was found Confirmed by Wonda Sharper [phone]) on 04/17/2024 2:43:19 PM    Recent Labs: 04/10/2024: ALT 14; BUN 17; Creatinine, Ser 0.81; Hemoglobin 12.1; Platelets 319; Potassium 4.1; Sodium 139  Recent Lipid Panel No results found for: CHOL, TRIG, HDL, CHOLHDL, VLDL, LDLCALC, LDLDIRECT   Risk Assessment/Calculations:                Physical Exam:    VS:  BP 120/64 (BP Location: Left Arm, Patient Position: Sitting, Cuff Size: Normal)   Pulse 66   Ht 5' 3 (1.6 m)   Wt 132 lb (59.9 kg)   SpO2 95%   BMI 23.38 kg/m     Wt Readings from Last 3 Encounters:  04/17/24 132 lb (59.9 kg)  04/10/24 134 lb (60.8 kg)  11/03/14 136 lb (61.7 kg)     GEN:  Well nourished, well developed in no acute distress HEENT: Normal NECK: No JVD; No carotid bruits LYMPHATICS: No lymphadenopathy CARDIAC: RRR, no murmurs, rubs, gallops RESPIRATORY:  Clear to auscultation without rales, wheezing or rhonchi  ABDOMEN: Soft, non-tender, non-distended MUSCULOSKELETAL:  No edema; No deformity  SKIN: Warm and dry NEUROLOGIC:  Alert and oriented x 3 PSYCHIATRIC:  Normal affect   Assessment & Plan Chest pressure The patient has resting chest discomfort sometimes associated with heart palpitations.  Features have typical and atypical characteristics.  I have recommended a Lexiscan Myoview stress test for further evaluation.  I reviewed her recent data from the emergency room which included lab work and electrocardiogram as well as today's  electrocardiogram. SOB (shortness of breath) Benign exam.  Recommend 2D echo to evaluate for any structural heart abnormality, pulmonary hypertension, or other pertinent findings.  Lung exam normal. Palpitations Labs reviewed with no major electrolyte abnormalities.  Check 7-day ZIO monitor.       Informed Consent   Shared Decision Making/Informed Consent The risks [chest pain, shortness of breath, cardiac arrhythmias, dizziness, blood pressure fluctuations, myocardial infarction, stroke/transient ischemic attack, nausea, vomiting, allergic reaction, radiation exposure, metallic taste sensation and life-threatening complications (estimated to be 1 in 10,000)], benefits (risk stratification, diagnosing coronary artery disease, treatment guidance) and alternatives of a nuclear stress test were discussed in detail with Ms. Barrales and she agrees to proceed.       Medication Adjustments/Labs and Tests Ordered: Current medicines are reviewed at length with the patient today.  Concerns regarding medicines are outlined above.  Orders Placed This Encounter  Procedures   Cardiac Stress Test: Informed Consent Details: Physician/Practitioner Attestation; Transcribe to consent form and obtain patient signature   MYOCARDIAL PERFUSION IMAGING   LONG TERM MONITOR (3-14 DAYS)   EKG 12-Lead   ECHOCARDIOGRAM COMPLETE   No orders of the defined types were placed in this encounter.   Patient Instructions  Medication Instructions:  No medication changes were made at this visit. Continue current regimen.   *If you need a refill on your cardiac medications before your next appointment, please call your pharmacy*  Lab Work: None ordered today. If you have labs (blood work) drawn today and your tests are completely normal, you will receive your results only by: MyChart Message (if you have MyChart) OR A paper copy in the mail If you have any lab test that is abnormal or we need to change your  treatment, we will call you to review the results.  Testing/Procedures: Your physician has requested that you wear a Zio heart monitor for 7 days. This will be mailed to your home with instructions on how to apply the monitor and how to return it when finished. Please allow 2 weeks after returning the heart monitor before our office calls you with the results.   Your physician has requested that you have a lexiscan myoview. For further information please visit [email or web address] Please follow instruction sheet, as given.  Your physician has requested that you have an echocardiogram. Echocardiography is a painless test that uses sound waves to create images of your heart. It provides your doctor with information about the size and shape of your heart and how well your hearts chambers and valves are working. This procedure takes approximately one hour. There are no restrictions for this procedure. Please do NOT wear cologne, perfume, aftershave, or lotions (deodorant is allowed). Please arrive 15 minutes prior to your appointment time.  Please note: We ask at that you not bring children with you during ultrasound (echo/ vascular) testing. Due to room size and safety concerns, children are not allowed in the ultrasound rooms during exams. Our front office staff cannot provide observation of children in our lobby area while testing is being conducted. An adult accompanying a patient to their appointment will only be allowed in the ultrasound room at the discretion of the ultrasound technician under special circumstances. We apologize for any inconvenience.   Follow-Up: At [facility], you and your health needs are our priority.  As part of our continuing mission to provide you with exceptional heart care, our providers are all part of one team.  This team includes your primary Cardiologist (physician) and Advanced Practice Providers or APPs (Physician Assistants and Nurse Practitioners) who  all work together to provide you with the care you need, when you need it.  Your next appointment:   1 year(s)  Provider:   Ozell Fell, MD    We recommend signing up for the patient portal called MyChart.  Sign up information is provided on this After Visit Summary.  MyChart is used to connect with patients for Virtual Visits (Telemedicine).  Patients are able to view lab/test results, encounter notes, upcoming appointments, etc.  Non-urgent messages can be sent to your provider as well.   To learn more about what you can do with MyChart, go to [email or web address].   Other Instructions Lexiscan Myoview (Stress Test) Instructions  Please arrive 15 minutes prior to your appointment time for registration and insurance purposes.  The test will take approximately 3 to 4 hours to complete; you may bring reading material.  If someone comes with you to your appointment, they will need to remain in the main lobby due to limited space in the testing area. **If you are pregnant or breastfeeding, please notify the nuclear lab prior to your appointment**   How to prepare for your Myocardial Perfusion Test: Do not eat or drink 3 hours prior to your test, except you may have water . Do not consume products containing caffeine (regular or decaffeinated) 12 hours prior to your test. (ex: coffee, chocolate, sodas, tea). Do bring a list of your current medications with you.   Do wear comfortable clothes (no dresses or overalls) and walking shoes, tennis shoes preferred (No heels or open toe shoes are allowed). Do NOT wear cologne, perfume, aftershave, or lotions (deodorant is allowed). If these instructions are not followed, your test will have to be rescheduled.   Please report to [address], [place], [place] [address] for your test.  If you have questions or concerns about your appointment, you can call the Nuclear Lab at [phone].   If you cannot keep your appointment, please provide 24  hours notification to the Nuclear Lab, to avoid a possible $50 charge to your account. ------------------------------------------------------------------------------------------------------  ZIO XT- Long Term Monitor Instructions  Your physician has requested you wear a ZIO patch monitor for 7 days.   This is a single patch monitor. [employer] supplies one patch monitor per enrollment. Additional  stickers are not available. Please do not apply patch if you will be having a Nuclear Stress Test,  Echocardiogram, Cardiac CT, MRI, or Chest Xray during the period you would be wearing the  monitor. The patch cannot be worn during these tests. You cannot remove and re-apply the  ZIO XT patch monitor.   Your ZIO patch monitor will be mailed 3 day USPS to your address on file. It may take 3-5 days  to receive your monitor after you have been enrolled.   Once you have received your monitor, please review the enclosed instructions. Your monitor  has already been registered assigning a specific monitor serial # to you.     Billing and Patient Assistance Program Information  We have supplied [employer] with any of your insurance information on file for billing purposes.  [employer] offers a sliding scale Patient Assistance Program for patients that do not have  insurance, or whose insurance does not completely cover the cost of the ZIO monitor.  You must apply for the Patient Assistance Program to qualify for this discounted rate.   To apply, please call [employer] at [phone], select option 4, select option 2, ask to apply for  Patient Assistance Program. Meredeth will ask your household income, and how many people  are in your household. They will quote your out-of-pocket cost based on that information.  [employer] will also be able to set up a 13-month, interest-free payment plan if needed.     Applying the monitor  Shave hair from upper left chest.  Hold abrader disc by orange tab. Rub abrader in 40  strokes over the upper left chest as  indicated in your monitor instructions.  Clean area with 4 enclosed alcohol  pads. Let dry.  Apply patch as indicated in monitor instructions. Patch will be placed under collarbone on left  side of chest with arrow pointing upward.  Rub patch adhesive wings for 2 minutes. Remove white label marked 1. Remove the white  label  marked 2. Rub patch adhesive wings for 2 additional minutes.  While looking in a mirror, press and release button in center of patch. A small green light will  flash 3-4 times. This will be your only indicator that the monitor has been turned on.  Do not shower for the first 24 hours. You may shower after the first 24 hours.  Press the button if you feel a symptom. You will hear a small click. Record Date, Time and  Symptom in the Patient Logbook.  When you are ready to remove the patch, follow instructions on the last 2 pages of Patient  Logbook. Stick patch monitor onto the last page of Patient Logbook.   Place Patient Logbook in the blue and white box. Use locking tab on box and tape box closed  securely. The blue and white box has prepaid postage on it. Please place it in the mailbox as  soon as possible. Your physician should have your test results approximately 7 days after the  monitor has been mailed back to [facility].   Call [facility] Customer Care at [phone] if you have questions regarding  your ZIO XT patch monitor. Call them immediately if you see an orange light blinking on your  monitor.   If your monitor falls off in less than 4 days, contact our Monitor department at [phone].   If your monitor becomes loose or falls off after 4 days call [employer] at [phone] for  suggestions on securing your monitor.       Signed, Ozell Fell, MD  04/17/2024 5:55 PM    [facility]     [1]  Current Meds  Medication Sig   baclofen (LIORESAL) 10 MG tablet TAKE 1 TABLET BY MOUTH  THREE TIMES DAILY AS NEEDED FOR SPASMS OR PAIN   calcitonin, salmon, (MIACALCIN/FORTICAL) 200 UNIT/ACT nasal spray Place 1 spray into alternate nostrils daily.    Calcium Carbonate-Vitamin D (CALCIUM + D PO) Take 1,800 mg by mouth daily.   denosumab  (PROLIA ) 60 MG/ML SOSY injection Inject 60 mg into the skin.   esomeprazole (NEXIUM) 40 MG capsule TAKE 1 CAPSULE BY MOUTH ONCE DAILY BEFORE BREAKFAST   fluticasone (FLONASE) 50 MCG/ACT nasal spray Place 2 sprays into the nose as needed.    gabapentin  (NEURONTIN ) 300 MG capsule Take 300 mg by mouth 2 (two) times daily. And 600 mg at night   Multiple Vitamins-Minerals (MULTIVITAMIN WITH MINERALS) tablet Take 1 tablet by mouth daily.    Omega-3 Fatty Acids (FISH OIL) 1200 MG CAPS Take 3 capsules by mouth daily.    pentosan polysulfate (ELMIRON ) 100 MG capsule Take 100 mg by mouth daily.    sucralfate  (CARAFATE ) 1 GM/10ML suspension Take 10 mLs (1 g total) by mouth 2 (two) times daily.   "

## 2024-04-20 ENCOUNTER — Ambulatory Visit: Admitting: Physical Therapy

## 2024-04-20 DIAGNOSIS — M25611 Stiffness of right shoulder, not elsewhere classified: Secondary | ICD-10-CM

## 2024-04-20 DIAGNOSIS — M25511 Pain in right shoulder: Secondary | ICD-10-CM | POA: Diagnosis not present

## 2024-04-20 DIAGNOSIS — G8929 Other chronic pain: Secondary | ICD-10-CM

## 2024-04-20 NOTE — Therapy (Signed)
 " OUTPATIENT PHYSICAL THERAPY SHOULDER TREATMENT  Patient Name: [name] MRN: [id number] DOB:Oct 21, 1950, 73 y.o., female Today's Date: 04/20/2024  END OF SESSION:  PT End of Session - 04/20/24 1616     Visit Number 5    Number of Visits 12    Date for Recertification  05/18/24    PT Start Time 0315    PT Stop Time 0412    PT Time Calculation (min) 57 min    Activity Tolerance Patient tolerated treatment well    Behavior During Therapy WFL for tasks assessed/performed           Past Medical History:  Diagnosis Date   Anxiety    hx panic attacks;  no meds   Arthritis NECK AND SHOULDERS   Chronic facial pain SECONDARY TO NERVE PALSY--  TAKES GABAPENTIN    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   Frequency of urination    GERD (gastroesophageal reflux disease)    H/O hiatal hernia    History of kidney stones    Interstitial cystitis    Nocturia    PONV (postoperative nausea and vomiting)    Urgency of urination    Past Surgical History:  Procedure Laterality Date   ABDOMINAL HYSTERECTOMY  1987   AND BURCH PROCEDURE  (BLADDER SUSPENSION)   ATTEMPTED URETEROSCOPIC STONE EXTRACTION  2003   BLADDER SLING PROCEDURE  2005   BREAST REDUCTION SURGERY  2009   BILATERAL   BUNIONECTOMY  SEPT 2005   LEFT FOOT   EXTRACORPOREAL SHOCK WAVE LITHOTRIPSY  2002   RIGHT SIDE   LAPAROSCOPIC LYSIS OF ADHESIONS  03/03/2012   Procedure: LAPAROSCOPIC LYSIS OF ADHESIONS;  Surgeon: Alm JAYSON Cook, MD;  Location: [facility];  Service: Gynecology;  Laterality: N/A;   LAPAROSCOPY  03/03/2012   Procedure: LAPAROSCOPY OPERATIVE;  Surgeon: Alm JAYSON Cook, MD;  Location: [facility];  Service: Gynecology;  Laterality: N/A;   NASAL SINUS SURGERY  DEC 2009   PUBOVAGINAL SLING  04/05/2011   Procedure: CARLOYN GLADE;  Surgeon: Arlena LILLETTE Gal, MD;  Location: [facility];  Service: Urology;  Laterality: N/A;  ANTERIOR UPHOLD LITE   AND  CYSTOSCOPY   RECTOCELE REPAIR N/A 08/18/2012   Procedure: REPAIR POSTERIOR VAGINAL WALL SCAR AND VAGINOPLASTY ;  Surgeon: Arlena LILLETTE Gal, MD;  Location: [facility];  Service: Urology;  Laterality: N/A;   RIGHT SHOULDER SURGERY  DEC 2005   TUBAL LIGATION  1980   VAGINAL PROLAPSE REPAIR  03/03/2012   Procedure: VAGINAL VAULT SUSPENSION;  Surgeon: Arlena LILLETTE Gal, MD;  Location: [facility];  Service: Urology;  Laterality: N/A;  repair with xenform with ileococcygeus repair   Patient Active Problem List   Diagnosis Date Noted   SOB (shortness of breath) 11/04/2014   Constipation 01/08/2011   REFERRING PROVIDER: Elspeth Her MD  REFERRING DIAG: Tear of RTC s/p surgery.    THERAPY DIAG:  Chronic right shoulder pain  Stiffness of right shoulder, not elsewhere classified  Rationale for Evaluation and Treatment: Rehabilitation  ONSET DATE: Surgery date (04/01/24).    SUBJECTIVE:  SUBJECTIVE STATEMENT: No new complaints.    PAIN:  Are you having pain? Yes: NPRS scale: 5/10.   Pain location: Right shoulder. Pain description: Throbbing and sore.   Aggravating factors: Hanging down.   Relieving factors: Elevate  PRECAUTIONS: Other: Progress per protocol.    RED FLAGS: None   WEIGHT BEARING RESTRICTIONS: No right UE weight bearing.  FALLS:  Has patient fallen in last 6 months? Yes. Number of falls 1.  LIVING ENVIRONMENT: Lives with: lives with their family Lives in: House/apartment Has following equipment at home: None  OCCUPATION: Retired.  PLOF: Independent  PATIENT GOALS:  Use right shoulder without pain.    NEXT MD VISIT:   OBJECTIVE:   PATIENT SURVEYS:  QUICKDASH:  48 points (84%).     UPPER EXTREMITY ROM:   In supine:  Gentle P/AROM  flexion is 80 degrees and ER is 30 degrees.   PALPATION:  The patient c/o diffuse right shoulder pain.  Post-surgical dressing removed and steri-strips intact.  Ecchymosis present as expected.                                                                                                                               TREATMENT DATE:   04/20/24:  In supine:  Gentle P/AROM into right shoulder flexion and ER x 23 minutes f/b vasopneumatic x 15 minutes on low with pillow between right elbow and thorax.      04/16/24:  In supine:  Gentle P/AROM into right shoulder flexion and ER x 38 minutes f/b vasopneumatic x 15 minutes on low with pillow between right elbow and thorax.    04/13/24:  In supine:  Gentle P/AROM into right shoulder flexion and ER x 23 minutes f/b vasopneumatic x 15 minutes on low with pillow between right elbow and thorax.     PATIENT EDUCATION: Education details: See below. Person educated: Patient and Caregiver   Education method: Medical Illustrator Education comprehension: verbalized understanding, handout  HOME EXERCISE PROGRAM: CANE CANE Created by Ronnetta Currington Dec 11th, 2025 View at [email or web address] code [id number] WAND EXTERNAL ROTATION - SUPINE ER Lie on your back holding a cane or wand with both hands. On the affected side, place a small rolled up towel or pillow under your elbow. Maintain approx. 90 degree bend at the elbow with your arm approximately 30-45 degrees away from your side. GENTLE. PAIN-FREE Use your other arm to pull the wand/cane to rotate the affected arm back into a stretch. Hold and then return to starting position and then repeat. **This can be done with your elbow on the armrest of your recliner** Repeat 10 Times Hold 10 Seconds Complete 2 Sets Perform 4 Times a Day  ASSESSMENT:  CLINICAL IMPRESSION: Patient is doing very well.  We reviewed her HEP today (pendulums, supine and seated cane exercise for ER, thigh slides,  hitch hiker/gate opening and countertop walk aways.  She performed with excellent technique and no complaints.  OBJECTIVE IMPAIRMENTS: decreased activity tolerance, decreased ROM, and pain.   ACTIVITY LIMITATIONS: carrying, lifting, bending, bathing, dressing, and reach over head  PARTICIPATION LIMITATIONS: meal prep, cleaning, and laundry  PERSONAL FACTORS: Time since onset of injury/illness/exacerbation and 1 comorbidity: cervical dystonia are also affecting patient's functional outcome.   REHAB POTENTIAL: Good  CLINICAL DECISION MAKING: Stable/uncomplicated  EVALUATION COMPLEXITY: Low   GOALS:  SHORT TERM GOALS: Target date: 04/20/24.  Ind with an initial HEP. Goal status: INITIAL   LONG TERM GOALS: Target date: 07/05/24.  Ind with an advanced HEP.  Goal status: INITIAL  2.  Active right shoulder flexion to 145 degrees so the patient can easily reach overhead.  Goal status: INITIAL  3.  Active ER to 70 degrees+ to allow for easily donning/doffing of apparel.  Goal status: INITIAL  4.  Increase shoulder strength to a solid 4+/5 to increase stability for performance of functional activities.  Goal status: INITIAL  5.  Perform ADL's with pain not > 3/10.  Goal status: INITIAL  PLAN:  PT FREQUENCY: 2x/week  PT DURATION: 6 weeks  PLANNED INTERVENTIONS: 97110-Therapeutic exercises, 97530- Therapeutic activity, [id number]- Neuromuscular re-education, 97535- Self Care, [id number]- Manual therapy, [id number]- Electrical stimulation (unattended), 97016- Vasopneumatic device, 97035- Ultrasound, Patient/Family education, and Cryotherapy  PLAN FOR NEXT SESSION: Progress per protocol:  Begin with gentle right shoulder P/AROM.  Vasopneumatic with pillow between thorax and elbow.     Omie Ferger, PT 04/20/2024, 4:16 PM  "

## 2024-04-21 ENCOUNTER — Telehealth (HOSPITAL_COMMUNITY): Payer: Self-pay

## 2024-04-21 NOTE — Telephone Encounter (Signed)
 Detailed instructions left on the patient's answering machine. S.Tayjah Lobdell CCT

## 2024-04-24 ENCOUNTER — Other Ambulatory Visit: Payer: Self-pay | Admitting: Cardiovascular Disease

## 2024-04-24 DIAGNOSIS — R002 Palpitations: Secondary | ICD-10-CM

## 2024-04-24 DIAGNOSIS — R0789 Other chest pain: Secondary | ICD-10-CM

## 2024-04-24 DIAGNOSIS — R0602 Shortness of breath: Secondary | ICD-10-CM

## 2024-04-27 ENCOUNTER — Ambulatory Visit: Admitting: Physical Therapy

## 2024-04-27 DIAGNOSIS — M25511 Pain in right shoulder: Secondary | ICD-10-CM | POA: Diagnosis not present

## 2024-04-27 DIAGNOSIS — G8929 Other chronic pain: Secondary | ICD-10-CM

## 2024-04-27 DIAGNOSIS — M25611 Stiffness of right shoulder, not elsewhere classified: Secondary | ICD-10-CM

## 2024-04-27 NOTE — Therapy (Signed)
 " OUTPATIENT PHYSICAL THERAPY SHOULDER TREATMENT  Patient Name: [name] MRN: [id number] DOB:04/09/1951, 73 y.o., female Today's Date: 04/27/2024  END OF SESSION:  PT End of Session - 04/27/24 1640     Visit Number 6    Number of Visits 12    Date for Recertification  05/18/24    PT Start Time 0311    PT Stop Time 0419    PT Time Calculation (min) 68 min    Activity Tolerance Patient tolerated treatment well    Behavior During Therapy WFL for tasks assessed/performed           Past Medical History:  Diagnosis Date   Anxiety    hx panic attacks;  no meds   Arthritis NECK AND SHOULDERS   Chronic facial pain SECONDARY TO NERVE PALSY--  TAKES GABAPENTIN    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   Frequency of urination    GERD (gastroesophageal reflux disease)    H/O hiatal hernia    History of kidney stones    Interstitial cystitis    Nocturia    PONV (postoperative nausea and vomiting)    Urgency of urination    Past Surgical History:  Procedure Laterality Date   ABDOMINAL HYSTERECTOMY  1987   AND BURCH PROCEDURE  (BLADDER SUSPENSION)   ATTEMPTED URETEROSCOPIC STONE EXTRACTION  2003   BLADDER SLING PROCEDURE  2005   BREAST REDUCTION SURGERY  2009   BILATERAL   BUNIONECTOMY  SEPT 2005   LEFT FOOT   EXTRACORPOREAL SHOCK WAVE LITHOTRIPSY  2002   RIGHT SIDE   LAPAROSCOPIC LYSIS OF ADHESIONS  03/03/2012   Procedure: LAPAROSCOPIC LYSIS OF ADHESIONS;  Surgeon: Alm JAYSON Cook, MD;  Location: [facility];  Service: Gynecology;  Laterality: N/A;   LAPAROSCOPY  03/03/2012   Procedure: LAPAROSCOPY OPERATIVE;  Surgeon: Alm JAYSON Cook, MD;  Location: [facility];  Service: Gynecology;  Laterality: N/A;   NASAL SINUS SURGERY  DEC 2009   PUBOVAGINAL SLING  04/05/2011   Procedure: CARLOYN GLADE;  Surgeon: Arlena LILLETTE Gal, MD;  Location: [facility];  Service: Urology;  Laterality: N/A;  ANTERIOR UPHOLD LITE   AND  CYSTOSCOPY   RECTOCELE REPAIR N/A 08/18/2012   Procedure: REPAIR POSTERIOR VAGINAL WALL SCAR AND VAGINOPLASTY ;  Surgeon: Arlena LILLETTE Gal, MD;  Location: [facility];  Service: Urology;  Laterality: N/A;   RIGHT SHOULDER SURGERY  DEC 2005   TUBAL LIGATION  1980   VAGINAL PROLAPSE REPAIR  03/03/2012   Procedure: VAGINAL VAULT SUSPENSION;  Surgeon: Arlena LILLETTE Gal, MD;  Location: [facility];  Service: Urology;  Laterality: N/A;  repair with xenform with ileococcygeus repair   Patient Active Problem List   Diagnosis Date Noted   SOB (shortness of breath) 11/04/2014   Constipation 01/08/2011   REFERRING PROVIDER: Elspeth Her MD  REFERRING DIAG: Tear of RTC s/p surgery.    THERAPY DIAG:  Chronic right shoulder pain  Stiffness of right shoulder, not elsewhere classified  Rationale for Evaluation and Treatment: Rehabilitation  ONSET DATE: Surgery date (04/01/24).    SUBJECTIVE:  SUBJECTIVE STATEMENT: No new complaints.    PAIN:  Are you having pain? Yes: NPRS scale: 5/10.   Pain location: Right shoulder. Pain description: Throbbing and sore.   Aggravating factors: Hanging down.   Relieving factors: Elevate  PRECAUTIONS: Other: Progress per protocol.    RED FLAGS: None   WEIGHT BEARING RESTRICTIONS: No right UE weight bearing.  FALLS:  Has patient fallen in last 6 months? Yes. Number of falls 1.  LIVING ENVIRONMENT: Lives with: lives with their family Lives in: House/apartment Has following equipment at home: None  OCCUPATION: Retired.  PLOF: Independent  PATIENT GOALS:  Use right shoulder without pain.    NEXT MD VISIT:   OBJECTIVE:   PATIENT SURVEYS:  QUICKDASH:  48 points (84%).     UPPER EXTREMITY ROM:   In supine:  Gentle P/AROM  flexion is 80 degrees and ER is 30 degrees.   PALPATION:  The patient c/o diffuse right shoulder pain.  Post-surgical dressing removed and steri-strips intact.  Ecchymosis present as expected.                                                                                                                               TREATMENT DATE:   04/27/24:  In supine:  Gentle P/AROM into right shoulder flexion and ER x 38 minutes f/b vasopneumatic x 15 minutes on low with pillow between right elbow and thorax.      04/20/24:  In supine:  Gentle P/AROM into right shoulder flexion and ER x 23 minutes f/b vasopneumatic x 15 minutes on low with pillow between right elbow and thorax.     PATIENT EDUCATION: Education details: See below. Person educated: Patient and Caregiver   Education method: Medical Illustrator Education comprehension: verbalized understanding, handout  HOME EXERCISE PROGRAM: CANE CANE Created by Merdis Snodgrass Dec 11th, 2025 View at [email or web address] code [id number] WAND EXTERNAL ROTATION - SUPINE ER Lie on your back holding a cane or wand with both hands. On the affected side, place a small rolled up towel or pillow under your elbow. Maintain approx. 90 degree bend at the elbow with your arm approximately 30-45 degrees away from your side. GENTLE. PAIN-FREE Use your other arm to pull the wand/cane to rotate the affected arm back into a stretch. Hold and then return to starting position and then repeat. **This can be done with your elbow on the armrest of your recliner** Repeat 10 Times Hold 10 Seconds Complete 2 Sets Perform 4 Times a Day  ASSESSMENT:  CLINICAL IMPRESSION: Patient is doing very well.  She has been compliant to her HEP.  She tolerated treatment without complaint today.  OBJECTIVE IMPAIRMENTS: decreased activity tolerance, decreased ROM, and pain.   ACTIVITY LIMITATIONS: carrying, lifting, bending, bathing, dressing, and reach over  head  PARTICIPATION LIMITATIONS: meal prep, cleaning, and laundry  PERSONAL FACTORS: Time since onset of injury/illness/exacerbation and 1 comorbidity: cervical dystonia are also affecting patient's functional outcome.  REHAB POTENTIAL: Good  CLINICAL DECISION MAKING: Stable/uncomplicated  EVALUATION COMPLEXITY: Low   GOALS:  SHORT TERM GOALS: Target date: 04/20/24.  Ind with an initial HEP. Goal status: INITIAL   LONG TERM GOALS: Target date: 07/05/24.  Ind with an advanced HEP.  Goal status: INITIAL  2.  Active right shoulder flexion to 145 degrees so the patient can easily reach overhead.  Goal status: INITIAL  3.  Active ER to 70 degrees+ to allow for easily donning/doffing of apparel.  Goal status: INITIAL  4.  Increase shoulder strength to a solid 4+/5 to increase stability for performance of functional activities.  Goal status: INITIAL  5.  Perform ADL's with pain not > 3/10.  Goal status: INITIAL  PLAN:  PT FREQUENCY: 2x/week  PT DURATION: 6 weeks  PLANNED INTERVENTIONS: 97110-Therapeutic exercises, 97530- Therapeutic activity, [id number]- Neuromuscular re-education, 97535- Self Care, [id number]- Manual therapy, [id number]- Electrical stimulation (unattended), 97016- Vasopneumatic device, 97035- Ultrasound, Patient/Family education, and Cryotherapy  PLAN FOR NEXT SESSION: Progress per protocol:  Begin with gentle right shoulder P/AROM.  Vasopneumatic with pillow between thorax and elbow.     Dorotha Hirschi, PT 04/27/2024, 4:42 PM  "

## 2024-04-28 ENCOUNTER — Ambulatory Visit (HOSPITAL_COMMUNITY)
Admission: RE | Admit: 2024-04-28 | Discharge: 2024-04-28 | Disposition: A | Discharge status: Home / Self Care | Source: Ambulatory Visit | Attending: Cardiovascular Disease | Admitting: Cardiovascular Disease

## 2024-04-28 DIAGNOSIS — R0602 Shortness of breath: Secondary | ICD-10-CM | POA: Insufficient documentation

## 2024-04-28 DIAGNOSIS — R0789 Other chest pain: Secondary | ICD-10-CM | POA: Diagnosis not present

## 2024-04-28 DIAGNOSIS — R002 Palpitations: Secondary | ICD-10-CM | POA: Insufficient documentation

## 2024-04-28 LAB — MYOCARDIAL PERFUSION IMAGING
Base ST Depression (mm): 0 mm
LV dias vol: 63 mL (ref 46–106)
LV sys vol: 13 mL
Nuc Stress EF: 79 %
Peak HR: 100 {beats}/min
Rest HR: 61 {beats}/min
Rest Nuclear Isotope Dose: 10.7 mCi
SDS: 0
SRS: 0
SSS: 0
ST Depression (mm): 0 mm
Stress Nuclear Isotope Dose: 32.3 mCi
TID: 1.13

## 2024-04-28 MED ORDER — TECHNETIUM TC 99M TETROFOSMIN IV KIT
10.7000 | PACK | Freq: Once | INTRAVENOUS | Status: AC | PRN
  Administered 2024-04-28: 10.7 via INTRAVENOUS

## 2024-04-28 MED ORDER — REGADENOSON 0.4 MG/5ML IV SOLN
INTRAVENOUS | Status: AC
  Filled 2024-04-28: qty 5

## 2024-04-28 MED ORDER — REGADENOSON 0.4 MG/5ML IV SOLN
0.4000 mg | Freq: Once | INTRAVENOUS | Status: AC
  Administered 2024-04-28: 0.4 mg via INTRAVENOUS

## 2024-04-28 MED ORDER — TECHNETIUM TC 99M TETROFOSMIN IV KIT
32.3000 | PACK | Freq: Once | INTRAVENOUS | Status: AC | PRN
  Administered 2024-04-28: 32.3 via INTRAVENOUS

## 2024-04-29 ENCOUNTER — Encounter: Payer: Self-pay | Admitting: Physical Therapy

## 2024-04-29 ENCOUNTER — Ambulatory Visit: Admitting: Physical Therapy

## 2024-04-29 DIAGNOSIS — M25611 Stiffness of right shoulder, not elsewhere classified: Secondary | ICD-10-CM

## 2024-04-29 DIAGNOSIS — G8929 Other chronic pain: Secondary | ICD-10-CM

## 2024-04-29 DIAGNOSIS — M62838 Other muscle spasm: Secondary | ICD-10-CM

## 2024-04-29 DIAGNOSIS — M25511 Pain in right shoulder: Secondary | ICD-10-CM | POA: Diagnosis not present

## 2024-04-29 NOTE — Therapy (Signed)
 " OUTPATIENT PHYSICAL THERAPY SHOULDER TREATMENT  Patient Name: [name] MRN: [id number] DOB:07/12/1950, 73 y.o., female Today's Date: 04/29/2024  END OF SESSION:  PT End of Session - 04/29/24 1121     Visit Number 7    Number of Visits 12    Date for Recertification  05/18/24    PT Start Time 1015    PT Stop Time 1114    PT Time Calculation (min) 59 min    Activity Tolerance Patient tolerated treatment well    Behavior During Therapy WFL for tasks assessed/performed           Past Medical History:  Diagnosis Date   Anxiety    hx panic attacks;  no meds   Arthritis NECK AND SHOULDERS   Chronic facial pain SECONDARY TO NERVE PALSY--  TAKES GABAPENTIN    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   Frequency of urination    GERD (gastroesophageal reflux disease)    H/O hiatal hernia    History of kidney stones    Interstitial cystitis    Nocturia    PONV (postoperative nausea and vomiting)    Urgency of urination    Past Surgical History:  Procedure Laterality Date   ABDOMINAL HYSTERECTOMY  1987   AND BURCH PROCEDURE  (BLADDER SUSPENSION)   ATTEMPTED URETEROSCOPIC STONE EXTRACTION  2003   BLADDER SLING PROCEDURE  2005   BREAST REDUCTION SURGERY  2009   BILATERAL   BUNIONECTOMY  SEPT 2005   LEFT FOOT   EXTRACORPOREAL SHOCK WAVE LITHOTRIPSY  2002   RIGHT SIDE   LAPAROSCOPIC LYSIS OF ADHESIONS  03/03/2012   Procedure: LAPAROSCOPIC LYSIS OF ADHESIONS;  Surgeon: Alm JAYSON Cook, MD;  Location: [facility];  Service: Gynecology;  Laterality: N/A;   LAPAROSCOPY  03/03/2012   Procedure: LAPAROSCOPY OPERATIVE;  Surgeon: Alm JAYSON Cook, MD;  Location: [facility];  Service: Gynecology;  Laterality: N/A;   NASAL SINUS SURGERY  DEC 2009   PUBOVAGINAL SLING  04/05/2011   Procedure: CARLOYN GLADE;  Surgeon: Arlena LILLETTE Gal, MD;  Location: [facility];  Service: Urology;  Laterality: N/A;  ANTERIOR UPHOLD LITE   AND  CYSTOSCOPY   RECTOCELE REPAIR N/A 08/18/2012   Procedure: REPAIR POSTERIOR VAGINAL WALL SCAR AND VAGINOPLASTY ;  Surgeon: Arlena LILLETTE Gal, MD;  Location: [facility];  Service: Urology;  Laterality: N/A;   RIGHT SHOULDER SURGERY  DEC 2005   TUBAL LIGATION  1980   VAGINAL PROLAPSE REPAIR  03/03/2012   Procedure: VAGINAL VAULT SUSPENSION;  Surgeon: Arlena LILLETTE Gal, MD;  Location: [facility];  Service: Urology;  Laterality: N/A;  repair with xenform with ileococcygeus repair   Patient Active Problem List   Diagnosis Date Noted   SOB (shortness of breath) 11/04/2014   Constipation 01/08/2011   REFERRING PROVIDER: Elspeth Her MD  REFERRING DIAG: Tear of RTC s/p surgery.    THERAPY DIAG:  Chronic right shoulder pain  Stiffness of right shoulder, not elsewhere classified  Other muscle spasm  Rationale for Evaluation and Treatment: Rehabilitation  ONSET DATE: Surgery date (04/01/24).    SUBJECTIVE:  SUBJECTIVE STATEMENT: No new complaints.    PAIN:  Are you having pain? Yes: NPRS scale: 5/10.   Pain location: Right shoulder. Pain description: Throbbing and sore.   Aggravating factors: Hanging down.   Relieving factors: Elevate  PRECAUTIONS: Other: Progress per protocol.    RED FLAGS: None   WEIGHT BEARING RESTRICTIONS: No right UE weight bearing.  FALLS:  Has patient fallen in last 6 months? Yes. Number of falls 1.  LIVING ENVIRONMENT: Lives with: lives with their family Lives in: House/apartment Has following equipment at home: None  OCCUPATION: Retired.  PLOF: Independent  PATIENT GOALS:  Use right shoulder without pain.    NEXT MD VISIT:   OBJECTIVE:   PATIENT SURVEYS:  QUICKDASH:  48 points (84%).     UPPER EXTREMITY ROM:   In  supine:  Gentle P/AROM flexion is 80 degrees and ER is 30 degrees.   PALPATION:  The patient c/o diffuse right shoulder pain.  Post-surgical dressing removed and steri-strips intact.  Ecchymosis present as expected.                                                                                                                               TREATMENT DATE:   04/29/24:  Seated UE Ranger x 6 minutes f/b Swiiss ball roll outs (ball on table) x 5 minutes then In supine:  Gentle P/AROM into right shoulder flexion and ER x 27 minutes f/b vasopneumatic x 15 minutes on low with pillow between right elbow and thorax.     04/27/24:  In supine:  Gentle P/AROM into right shoulder flexion and ER x 38 minutes f/b vasopneumatic x 15 minutes on low with pillow between right elbow and thorax.      04/20/24:  In supine:  Gentle P/AROM into right shoulder flexion and ER x 23 minutes f/b vasopneumatic x 15 minutes on low with pillow between right elbow and thorax.     PATIENT EDUCATION: Education details: See below. Person educated: Patient and Caregiver   Education method: Medical Illustrator Education comprehension: verbalized understanding, handout  HOME EXERCISE PROGRAM: CANE CANE Created by Keanen Dohse Dec 11th, 2025 View at [email or web address] code [id number] WAND EXTERNAL ROTATION - SUPINE ER Lie on your back holding a cane or wand with both hands. On the affected side, place a small rolled up towel or pillow under your elbow. Maintain approx. 90 degree bend at the elbow with your arm approximately 30-45 degrees away from your side. GENTLE. PAIN-FREE Use your other arm to pull the wand/cane to rotate the affected arm back into a stretch. Hold and then return to starting position and then repeat. **This can be done with your elbow on the armrest of your recliner** Repeat 10 Times Hold 10 Seconds Complete 2 Sets Perform 4 Times a Day  ASSESSMENT:  CLINICAL IMPRESSION: Patient  did very well with the addition of seated UE Ranger and ball rollouts.  OBJECTIVE IMPAIRMENTS: decreased activity  tolerance, decreased ROM, and pain.   ACTIVITY LIMITATIONS: carrying, lifting, bending, bathing, dressing, and reach over head  PARTICIPATION LIMITATIONS: meal prep, cleaning, and laundry  PERSONAL FACTORS: Time since onset of injury/illness/exacerbation and 1 comorbidity: cervical dystonia are also affecting patient's functional outcome.   REHAB POTENTIAL: Good  CLINICAL DECISION MAKING: Stable/uncomplicated  EVALUATION COMPLEXITY: Low   GOALS:  SHORT TERM GOALS: Target date: 04/20/24.  Ind with an initial HEP. Goal status: INITIAL   LONG TERM GOALS: Target date: 07/05/24.  Ind with an advanced HEP.  Goal status: INITIAL  2.  Active right shoulder flexion to 145 degrees so the patient can easily reach overhead.  Goal status: INITIAL  3.  Active ER to 70 degrees+ to allow for easily donning/doffing of apparel.  Goal status: INITIAL  4.  Increase shoulder strength to a solid 4+/5 to increase stability for performance of functional activities.  Goal status: INITIAL  5.  Perform ADL's with pain not > 3/10.  Goal status: INITIAL  PLAN:  PT FREQUENCY: 2x/week  PT DURATION: 6 weeks  PLANNED INTERVENTIONS: 97110-Therapeutic exercises, 97530- Therapeutic activity, [id number]- Neuromuscular re-education, 97535- Self Care, 02859- Manual therapy, [id number]- Electrical stimulation (unattended), 97016- Vasopneumatic device, 97035- Ultrasound, Patient/Family education, and Cryotherapy  PLAN FOR NEXT SESSION: Progress per protocol:  Begin with gentle right shoulder P/AROM.  Vasopneumatic with pillow between thorax and elbow.     Chanese Hartsough, PT 04/29/2024, 11:25 AM  "

## 2024-05-04 ENCOUNTER — Ambulatory Visit: Attending: Anesthesiology

## 2024-05-04 DIAGNOSIS — G8929 Other chronic pain: Secondary | ICD-10-CM | POA: Insufficient documentation

## 2024-05-04 DIAGNOSIS — M25611 Stiffness of right shoulder, not elsewhere classified: Secondary | ICD-10-CM | POA: Insufficient documentation

## 2024-05-04 DIAGNOSIS — M25511 Pain in right shoulder: Secondary | ICD-10-CM | POA: Diagnosis present

## 2024-05-04 DIAGNOSIS — M62838 Other muscle spasm: Secondary | ICD-10-CM | POA: Diagnosis present

## 2024-05-04 NOTE — Therapy (Signed)
 " OUTPATIENT PHYSICAL THERAPY SHOULDER TREATMENT  Patient Name: [name] MRN: [id number] DOB:[birth year], 74 y.o., female Today's Date: 05/04/2024  END OF SESSION:  PT End of Session - 05/04/24 1308     Visit Number 8    Number of Visits 12    Date for Recertification  05/18/24    PT Start Time 1300    PT Stop Time 1359    PT Time Calculation (min) 59 min    Activity Tolerance Patient tolerated treatment well    Behavior During Therapy WFL for tasks assessed/performed           Past Medical History:  Diagnosis Date   Anxiety    hx panic attacks;  no meds   Arthritis NECK AND SHOULDERS   Chronic facial pain SECONDARY TO NERVE PALSY--  TAKES GABAPENTIN    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   Frequency of urination    GERD (gastroesophageal reflux disease)    H/O hiatal hernia    History of kidney stones    Interstitial cystitis    Nocturia    PONV (postoperative nausea and vomiting)    Urgency of urination    Past Surgical History:  Procedure Laterality Date   ABDOMINAL HYSTERECTOMY  1987   AND BURCH PROCEDURE  (BLADDER SUSPENSION)   ATTEMPTED URETEROSCOPIC STONE EXTRACTION  2003   BLADDER SLING PROCEDURE  2005   BREAST REDUCTION SURGERY  2009   BILATERAL   BUNIONECTOMY  SEPT 2005   LEFT FOOT   EXTRACORPOREAL SHOCK WAVE LITHOTRIPSY  2002   RIGHT SIDE   LAPAROSCOPIC LYSIS OF ADHESIONS  03/03/2012   Procedure: LAPAROSCOPIC LYSIS OF ADHESIONS;  Surgeon: Alm JAYSON Cook, MD;  Location: [facility];  Service: Gynecology;  Laterality: N/A;   LAPAROSCOPY  03/03/2012   Procedure: LAPAROSCOPY OPERATIVE;  Surgeon: Alm JAYSON Cook, MD;  Location: [facility];  Service: Gynecology;  Laterality: N/A;   NASAL SINUS SURGERY  DEC 2009   PUBOVAGINAL SLING  04/05/2011   Procedure: CARLOYN GLADE;  Surgeon: Arlena LILLETTE Gal, MD;  Location: [facility];  Service: Urology;  Laterality: N/A;  ANTERIOR UPHOLD LITE   AND  CYSTOSCOPY   RECTOCELE REPAIR N/A 08/18/2012   Procedure: REPAIR POSTERIOR VAGINAL WALL SCAR AND VAGINOPLASTY ;  Surgeon: Arlena LILLETTE Gal, MD;  Location: [facility];  Service: Urology;  Laterality: N/A;   RIGHT SHOULDER SURGERY  DEC 2005   TUBAL LIGATION  1980   VAGINAL PROLAPSE REPAIR  03/03/2012   Procedure: VAGINAL VAULT SUSPENSION;  Surgeon: Arlena LILLETTE Gal, MD;  Location: [facility];  Service: Urology;  Laterality: N/A;  repair with xenform with ileococcygeus repair   Patient Active Problem List   Diagnosis Date Noted   SOB (shortness of breath) 11/04/2014   Constipation 01/08/2011   REFERRING PROVIDER: Elspeth Her MD  REFERRING DIAG: Tear of RTC s/p surgery.    THERAPY DIAG:  Chronic right shoulder pain  Stiffness of right shoulder, not elsewhere classified  Other muscle spasm  Acute pain of right shoulder  Rationale for Evaluation and Treatment: Rehabilitation  ONSET DATE: Surgery date (04/01/24).    SUBJECTIVE:  SUBJECTIVE STATEMENT: Pt reports 6/10 right shoulder pain today.     PAIN:  Are you having pain? Yes: NPRS scale: 6/10 Pain location: Right shoulder. Pain description: Throbbing and sore.   Aggravating factors: Hanging down.   Relieving factors: Elevate  PRECAUTIONS: Other: Progress per protocol.    RED FLAGS: None   WEIGHT BEARING RESTRICTIONS: No right UE weight bearing.  FALLS:  Has patient fallen in last 6 months? Yes. Number of falls 1.  LIVING ENVIRONMENT: Lives with: lives with their family Lives in: House/apartment Has following equipment at home: None  OCCUPATION: Retired.  PLOF: Independent  PATIENT GOALS:  Use right shoulder without pain.    NEXT MD VISIT:   OBJECTIVE:   PATIENT SURVEYS:  QUICKDASH:  48  points (84%).     UPPER EXTREMITY ROM:   In supine:  Gentle P/AROM flexion is 80 degrees and ER is 30 degrees.   PALPATION:  The patient c/o diffuse right shoulder pain.  Post-surgical dressing removed and steri-strips intact.  Ecchymosis present as expected.                                                                                                                               TREATMENT DATE:   05/05/23                                  EXERCISE LOG  Exercise Repetitions and Resistance Comments  Pulleys 5 mins   UE Ranger 6 mins   Ball Roll Outs 5 mins   Supine Flexion 20 reps w cane   Chest Press 20 reps w cane   ER 20 reps w cane    Blank cell = exercise not performed today   Manual Therapy Soft Tissue Mobilization: Right shoulder, STW/M to right deltoid and bicep to decrease pain and tone with several trigger points noted   Modalities  Date:  Vaso: Shoulder, 34 degrees; low pressure, 15 mins, Pain and Edema   04/29/24:  Seated UE Ranger x 6 minutes f/b Swiiss ball roll outs (ball on table) x 5 minutes then In supine:  Gentle P/AROM into right shoulder flexion and ER x 27 minutes f/b vasopneumatic x 15 minutes on low with pillow between right elbow and thorax.    04/27/24:  In supine:  Gentle P/AROM into right shoulder flexion and ER x 38 minutes f/b vasopneumatic x 15 minutes on low with pillow between right elbow and thorax.     PATIENT EDUCATION: Education details: See below. Person educated: Patient and Caregiver   Education method: Medical Illustrator Education comprehension: verbalized understanding, handout  HOME EXERCISE PROGRAM: CANE CANE Created by Chad Applegate Dec 11th, 2025 View at [email or web address] code [id number] WAND EXTERNAL ROTATION - SUPINE ER Lie on your back holding a cane or wand with both hands. On the affected side, place a small rolled up towel or  pillow under your elbow. Maintain approx. 90 degree bend at the elbow  with your arm approximately 30-45 degrees away from your side. GENTLE. PAIN-FREE Use your other arm to pull the wand/cane to rotate the affected arm back into a stretch. Hold and then return to starting position and then repeat. **This can be done with your elbow on the armrest of your recliner** Repeat 10 Times Hold 10 Seconds Complete 2 Sets Perform 4 Times a Day  ASSESSMENT:  CLINICAL IMPRESSION: Pt arrives for today's treatment session reporting 6/10 right shoulder pain.  Pt introduced to pulleys and AA cane exercises today with good results.  Pt requiring min cues for proper technique.  STW/M performed to right deltoid and bicep to decrease pain and tone. Normal responses to vaso noted upon removal.  Pt reported 4/10 right shoulder pain at completion of today's treatment session.   OBJECTIVE IMPAIRMENTS: decreased activity tolerance, decreased ROM, and pain.   ACTIVITY LIMITATIONS: carrying, lifting, bending, bathing, dressing, and reach over head  PARTICIPATION LIMITATIONS: meal prep, cleaning, and laundry  PERSONAL FACTORS: Time since onset of injury/illness/exacerbation and 1 comorbidity: cervical dystonia are also affecting patient's functional outcome.   REHAB POTENTIAL: Good  CLINICAL DECISION MAKING: Stable/uncomplicated  EVALUATION COMPLEXITY: Low   GOALS:  SHORT TERM GOALS: Target date: 04/20/24.  Ind with an initial HEP. Goal status: MET   LONG TERM GOALS: Target date: 07/05/24.  Ind with an advanced HEP.  Goal status: IN PROGRESS  2.  Active right shoulder flexion to 145 degrees so the patient can easily reach overhead.  Goal status: IN PROGRESS  3.  Active ER to 70 degrees+ to allow for easily donning/doffing of apparel.  Goal status: IN PROGRESS  4.  Increase shoulder strength to a solid 4+/5 to increase stability for performance of functional activities.  Goal status: IN PROGRESS  5.  Perform ADL's with pain not > 3/10.  Goal status: IN  PROGRESS  PLAN:  PT FREQUENCY: 2x/week  PT DURATION: 6 weeks  PLANNED INTERVENTIONS: 97110-Therapeutic exercises, 97530- Therapeutic activity, [id number]- Neuromuscular re-education, 97535- Self Care, [id number]- Manual therapy, G0283- Electrical stimulation (unattended), 97016- Vasopneumatic device, 97035- Ultrasound, Patient/Family education, and Cryotherapy  PLAN FOR NEXT SESSION: Progress per protocol:  Begin with gentle right shoulder P/AROM.  Vasopneumatic with pillow between thorax and elbow.     Delon DELENA Gosling, PTA 05/04/2024, 2:02 PM  "

## 2024-05-05 ENCOUNTER — Ambulatory Visit: Payer: Self-pay | Admitting: Cardiovascular Disease

## 2024-05-07 ENCOUNTER — Ambulatory Visit: Admitting: Physical Therapy

## 2024-05-07 DIAGNOSIS — G8929 Other chronic pain: Secondary | ICD-10-CM

## 2024-05-07 DIAGNOSIS — M25511 Pain in right shoulder: Secondary | ICD-10-CM | POA: Diagnosis not present

## 2024-05-07 DIAGNOSIS — M25611 Stiffness of right shoulder, not elsewhere classified: Secondary | ICD-10-CM

## 2024-05-07 DIAGNOSIS — M62838 Other muscle spasm: Secondary | ICD-10-CM

## 2024-05-07 NOTE — Therapy (Signed)
 " OUTPATIENT PHYSICAL THERAPY SHOULDER TREATMENT  Patient Name: [name] MRN: [id number] DOB:07-22-1950, 74 y.o., female Today's Date: 05/07/2024  END OF SESSION:  PT End of Session - 05/07/24 1304     Visit Number 9    Number of Visits 12    Date for Recertification  05/18/24    PT Start Time 0100    PT Stop Time 0200    PT Time Calculation (min) 60 min    Activity Tolerance Patient tolerated treatment well    Behavior During Therapy WFL for tasks assessed/performed            Past Medical History:  Diagnosis Date   Anxiety    hx panic attacks;  no meds   Arthritis NECK AND SHOULDERS   Chronic facial pain SECONDARY TO NERVE PALSY--  TAKES GABAPENTIN    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   Frequency of urination    GERD (gastroesophageal reflux disease)    H/O hiatal hernia    History of kidney stones    Interstitial cystitis    Nocturia    PONV (postoperative nausea and vomiting)    Urgency of urination    Past Surgical History:  Procedure Laterality Date   ABDOMINAL HYSTERECTOMY  1987   AND BURCH PROCEDURE  (BLADDER SUSPENSION)   ATTEMPTED URETEROSCOPIC STONE EXTRACTION  2003   BLADDER SLING PROCEDURE  2005   BREAST REDUCTION SURGERY  2009   BILATERAL   BUNIONECTOMY  SEPT 2005   LEFT FOOT   EXTRACORPOREAL SHOCK WAVE LITHOTRIPSY  2002   RIGHT SIDE   LAPAROSCOPIC LYSIS OF ADHESIONS  03/03/2012   Procedure: LAPAROSCOPIC LYSIS OF ADHESIONS;  Surgeon: Alm JAYSON Cook, MD;  Location: [facility];  Service: Gynecology;  Laterality: N/A;   LAPAROSCOPY  03/03/2012   Procedure: LAPAROSCOPY OPERATIVE;  Surgeon: Alm JAYSON Cook, MD;  Location: [facility];  Service: Gynecology;  Laterality: N/A;   NASAL SINUS SURGERY  DEC 2009   PUBOVAGINAL SLING  04/05/2011   Procedure: CARLOYN GLADE;  Surgeon: Arlena LILLETTE Gal, MD;  Location: [facility];  Service: Urology;  Laterality: N/A;  ANTERIOR UPHOLD LITE   AND  CYSTOSCOPY   RECTOCELE REPAIR N/A 08/18/2012   Procedure: REPAIR POSTERIOR VAGINAL WALL SCAR AND VAGINOPLASTY ;  Surgeon: Arlena LILLETTE Gal, MD;  Location: [facility];  Service: Urology;  Laterality: N/A;   RIGHT SHOULDER SURGERY  DEC 2005   TUBAL LIGATION  1980   VAGINAL PROLAPSE REPAIR  03/03/2012   Procedure: VAGINAL VAULT SUSPENSION;  Surgeon: Arlena LILLETTE Gal, MD;  Location: [facility];  Service: Urology;  Laterality: N/A;  repair with xenform with ileococcygeus repair   Patient Active Problem List   Diagnosis Date Noted   SOB (shortness of breath) 11/04/2014   Constipation 01/08/2011   REFERRING PROVIDER: Elspeth Her MD  REFERRING DIAG: Tear of RTC s/p surgery.    THERAPY DIAG:  Chronic right shoulder pain  Stiffness of right shoulder, not elsewhere classified  Other muscle spasm  Rationale for Evaluation and Treatment: Rehabilitation  ONSET DATE: Surgery date (04/01/24).    SUBJECTIVE:  SUBJECTIVE STATEMENT: Doing okay.  Can with right hand and brush teeth now.  PAIN:  Are you having pain? Yes: NPRS scale: 6/10 Pain location: Right shoulder. Pain description: Throbbing and sore.   Aggravating factors: Hanging down.   Relieving factors: Elevate  PRECAUTIONS: Other: Progress per protocol.    RED FLAGS: None   WEIGHT BEARING RESTRICTIONS: No right UE weight bearing.  FALLS:  Has patient fallen in last 6 months? Yes. Number of falls 1.  LIVING ENVIRONMENT: Lives with: lives with their family Lives in: House/apartment Has following equipment at home: None  OCCUPATION: Retired.  PLOF: Independent  PATIENT GOALS:  Use right shoulder without pain.    NEXT MD VISIT:   OBJECTIVE:   PATIENT SURVEYS:  QUICKDASH:  48 points (84%).      UPPER EXTREMITY ROM:  In supine:  Gentle P/AROM flexion is 80 degrees and ER is 30 degrees.   PALPATION:  The patient c/o diffuse right shoulder pain.  Post-surgical dressing removed and steri-strips intact.  Ecchymosis present as expected.                                                                                                                               TREATMENT DATE:  05/08/23:                                     EXERCISE LOG  Exercise Repetitions and Resistance Comments  Seated UE ranger  5 minutes    Pulleys  4 minutes   Blue Swiss ball on table 5 minutes    Supine bench press with cane 3 minutes   Supine flexion with cane 2 minutes   Supine flexion with cane diagonal  2 minutes   Gentle P/AROM into right shoulder flexion and ER x 17 minutes f/b vasopneumatic x 15 minutes on low with pillow between right elbow and thorax.      05/05/23                                  EXERCISE LOG  Exercise Repetitions and Resistance Comments  Pulleys 5 mins   UE Ranger 6 mins   Ball Roll Outs 5 mins   Supine Flexion 20 reps w cane   Chest Press 20 reps w cane   ER 20 reps w cane    Blank cell = exercise not performed today   Manual Therapy Soft Tissue Mobilization: Right shoulder, STW/M to right deltoid and bicep to decrease pain and tone with several trigger points noted   Modalities  Date:  Vaso: Shoulder, 34 degrees; low pressure, 15 mins, Pain and Edema   04/29/24:  Seated UE Ranger x 6 minutes f/b Swiiss ball roll outs (ball on table) x 5 minutes then In supine:  Gentle P/AROM into right shoulder  flexion and ER x 27 minutes f/b vasopneumatic x 15 minutes on low with pillow between right elbow and thorax.     PATIENT EDUCATION: Education details: See below. Person educated: Patient and Caregiver   Education method: Medical Illustrator Education comprehension: verbalized understanding, handout  HOME EXERCISE PROGRAM: CANE CANE SUPINE CANE   [TYEP7MG ]  AAROM Shoulder Scaption -  Repeat 15 Repetitions, Hold 3 Seconds, Complete 2 Sets, Perform 3 Times a Day  Shoulder forward flexion with cane -  Repeat 10 Repetitions, Hold 5 Seconds, Perform 3 Times a Day  BENCH PRESS - UNWEIGHTED -  Repeat 10 Repetitions, Complete 3 Sets, Perform 3 Times a Day  ASSESSMENT:  CLINICAL IMPRESSION: Patient did great today with the addition of over the door pulleys.  Handout provided for patient.  She is progressing toward goals.    OBJECTIVE IMPAIRMENTS: decreased activity tolerance, decreased ROM, and pain.   ACTIVITY LIMITATIONS: carrying, lifting, bending, bathing, dressing, and reach over head  PARTICIPATION LIMITATIONS: meal prep, cleaning, and laundry  PERSONAL FACTORS: Time since onset of injury/illness/exacerbation and 1 comorbidity: cervical dystonia are also affecting patient's functional outcome.   REHAB POTENTIAL: Good  CLINICAL DECISION MAKING: Stable/uncomplicated  EVALUATION COMPLEXITY: Low   GOALS:  SHORT TERM GOALS: Target date: 04/20/24.  Ind with an initial HEP. Goal status: MET   LONG TERM GOALS: Target date: 07/05/24.  Ind with an advanced HEP.  Goal status: IN PROGRESS  2.  Active right shoulder flexion to 145 degrees so the patient can easily reach overhead.  Goal status: IN PROGRESS  3.  Active ER to 70 degrees+ to allow for easily donning/doffing of apparel.  Goal status: IN PROGRESS  4.  Increase shoulder strength to a solid 4+/5 to increase stability for performance of functional activities.  Goal status: IN PROGRESS  5.  Perform ADL's with pain not > 3/10.  Goal status: IN PROGRESS  PLAN:  PT FREQUENCY: 2x/week  PT DURATION: 6 weeks  PLANNED INTERVENTIONS: 97110-Therapeutic exercises, 97530- Therapeutic activity, [id number]- Neuromuscular re-education, 97535- Self Care, 02859- Manual therapy, G0283- Electrical stimulation (unattended), 97016- Vasopneumatic device, 97035- Ultrasound,  Patient/Family education, and Cryotherapy  PLAN FOR NEXT SESSION: Progress per protocol:  Begin with gentle right shoulder P/AROM.  Vasopneumatic with pillow between thorax and elbow.     APPLEGATE, CHAD, PT 05/07/2024, 2:04 PM  "

## 2024-05-08 ENCOUNTER — Ambulatory Visit: Payer: Self-pay | Admitting: Cardiovascular Disease

## 2024-05-11 ENCOUNTER — Ambulatory Visit: Admitting: Physical Therapy

## 2024-05-11 DIAGNOSIS — M25511 Pain in right shoulder: Secondary | ICD-10-CM | POA: Diagnosis not present

## 2024-05-11 DIAGNOSIS — M25611 Stiffness of right shoulder, not elsewhere classified: Secondary | ICD-10-CM

## 2024-05-11 DIAGNOSIS — G8929 Other chronic pain: Secondary | ICD-10-CM

## 2024-05-11 NOTE — Therapy (Signed)
 " OUTPATIENT PHYSICAL THERAPY SHOULDER TREATMENT  Patient Name: [name] MRN: [id number] DOB:29-Apr-1951, 74 y.o., female Today's Date: 05/11/2024  END OF SESSION:  PT End of Session - 05/11/24 1410     Visit Number 10    Number of Visits 12    Date for Recertification  05/18/24    PT Start Time 0105    PT Stop Time 0202    PT Time Calculation (min) 57 min    Activity Tolerance Patient tolerated treatment well    Behavior During Therapy WFL for tasks assessed/performed             Past Medical History:  Diagnosis Date   Anxiety    hx panic attacks;  no meds   Arthritis NECK AND SHOULDERS   Chronic facial pain SECONDARY TO NERVE PALSY--  TAKES GABAPENTIN    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   Frequency of urination    GERD (gastroesophageal reflux disease)    H/O hiatal hernia    History of kidney stones    Interstitial cystitis    Nocturia    PONV (postoperative nausea and vomiting)    Urgency of urination    Past Surgical History:  Procedure Laterality Date   ABDOMINAL HYSTERECTOMY  1987   AND BURCH PROCEDURE  (BLADDER SUSPENSION)   ATTEMPTED URETEROSCOPIC STONE EXTRACTION  2003   BLADDER SLING PROCEDURE  2005   BREAST REDUCTION SURGERY  2009   BILATERAL   BUNIONECTOMY  SEPT 2005   LEFT FOOT   EXTRACORPOREAL SHOCK WAVE LITHOTRIPSY  2002   RIGHT SIDE   LAPAROSCOPIC LYSIS OF ADHESIONS  03/03/2012   Procedure: LAPAROSCOPIC LYSIS OF ADHESIONS;  Surgeon: Alm JAYSON Cook, MD;  Location: [facility];  Service: Gynecology;  Laterality: N/A;   LAPAROSCOPY  03/03/2012   Procedure: LAPAROSCOPY OPERATIVE;  Surgeon: Alm JAYSON Cook, MD;  Location: [facility];  Service: Gynecology;  Laterality: N/A;   NASAL SINUS SURGERY  DEC 2009   PUBOVAGINAL SLING  04/05/2011   Procedure: CARLOYN GLADE;  Surgeon: Arlena LILLETTE Gal, MD;  Location: [facility];  Service: Urology;  Laterality: N/A;  ANTERIOR UPHOLD LITE   AND  CYSTOSCOPY   RECTOCELE REPAIR N/A 08/18/2012   Procedure: REPAIR POSTERIOR VAGINAL WALL SCAR AND VAGINOPLASTY ;  Surgeon: Arlena LILLETTE Gal, MD;  Location: [facility];  Service: Urology;  Laterality: N/A;   RIGHT SHOULDER SURGERY  DEC 2005   TUBAL LIGATION  1980   VAGINAL PROLAPSE REPAIR  03/03/2012   Procedure: VAGINAL VAULT SUSPENSION;  Surgeon: Arlena LILLETTE Gal, MD;  Location: [facility];  Service: Urology;  Laterality: N/A;  repair with xenform with ileococcygeus repair   Patient Active Problem List   Diagnosis Date Noted   SOB (shortness of breath) 11/04/2014   Constipation 01/08/2011   REFERRING PROVIDER: Elspeth Her MD  REFERRING DIAG: Tear of RTC s/p surgery.    THERAPY DIAG:  Chronic right shoulder pain  Stiffness of right shoulder, not elsewhere classified  Rationale for Evaluation and Treatment: Rehabilitation  ONSET DATE: Surgery date (04/01/24).    SUBJECTIVE:  SUBJECTIVE STATEMENT: Doing okay.  Can with right hand and brush teeth now.  PAIN:  Are you having pain? Yes: NPRS scale: 6/10 Pain location: Right shoulder. Pain description: Throbbing and sore.   Aggravating factors: Hanging down.   Relieving factors: Elevate  PRECAUTIONS: Other: Progress per protocol.    RED FLAGS: None   WEIGHT BEARING RESTRICTIONS: No right UE weight bearing.  FALLS:  Has patient fallen in last 6 months? Yes. Number of falls 1.  LIVING ENVIRONMENT: Lives with: lives with their family Lives in: House/apartment Has following equipment at home: None  OCCUPATION: Retired.  PLOF: Independent  PATIENT GOALS:  Use right shoulder without pain.    NEXT MD VISIT:   OBJECTIVE:   PATIENT SURVEYS:  QUICKDASH:  48 points (84%).     UPPER EXTREMITY ROM:  In  supine:  Gentle P/AROM flexion is 80 degrees and ER is 30 degrees.   PALPATION:  The patient c/o diffuse right shoulder pain.  Post-surgical dressing removed and steri-strips intact.  Ecchymosis present as expected.                                                                                                                               TREATMENT DATE:   05/11/24:                                     EXERCISE LOG  Exercise Repetitions and Resistance Comments  Pulleys 6 minutes    Wall ladder 6 minutes (21).     Red Swiss ball on wall (both hands) 5 minutes   UE Ranger on wall  5 minutes        Gentle P/AROM into right shoulder flexion and ER x 16 minutes f/b vasopneumatic x 15 minutes on low with pillow between right elbow and thorax.   05/08/23:                                     EXERCISE LOG  Exercise Repetitions and Resistance Comments  Seated UE ranger  5 minutes    Pulleys  4 minutes   Blue Swiss ball on table 5 minutes    Supine bench press with cane 3 minutes   Supine flexion with cane 2 minutes   Supine flexion with cane diagonal  2 minutes   Gentle P/AROM into right shoulder flexion and ER x 17 minutes f/b vasopneumatic x 15 minutes on low with pillow between right elbow and thorax.      05/05/23                                  EXERCISE LOG  Exercise Repetitions and Resistance Comments  Pulleys 5 mins   UE Ranger  6 mins   [employer] Outs 5 mins   Supine Flexion 20 reps w cane   Chest Press 20 reps w cane   ER 20 reps w cane    Blank cell = exercise not performed today   Manual Therapy Soft Tissue Mobilization: Right shoulder, STW/M to right deltoid and bicep to decrease pain and tone with several trigger points noted   Modalities  Date:  Vaso: Shoulder, 34 degrees; low pressure, 15 mins, Pain and Edema   04/29/24:  Seated UE Ranger x 6 minutes f/b Swiiss ball roll outs (ball on table) x 5 minutes then In supine:  Gentle P/AROM into right shoulder flexion  and ER x 27 minutes f/b vasopneumatic x 15 minutes on low with pillow between right elbow and thorax.     PATIENT EDUCATION: Education details: See below. Person educated: Patient and Caregiver   Education method: Medical Illustrator Education comprehension: verbalized understanding, handout  HOME EXERCISE PROGRAM: CANE CANE SUPINE CANE  [TYEP7MG ]  AAROM Shoulder Scaption -  Repeat 15 Repetitions, Hold 3 Seconds, Complete 2 Sets, Perform 3 Times a Day  Shoulder forward flexion with cane -  Repeat 10 Repetitions, Hold 5 Seconds, Perform 3 Times a Day  BENCH PRESS - UNWEIGHTED -  Repeat 10 Repetitions, Complete 3 Sets, Perform 3 Times a Day  ASSESSMENT:  CLINICAL IMPRESSION: Patient did great today with the addition of over the door pulleys.  Handout provided for patient.  She is progressing toward goals.    OBJECTIVE IMPAIRMENTS: decreased activity tolerance, decreased ROM, and pain.   ACTIVITY LIMITATIONS: carrying, lifting, bending, bathing, dressing, and reach over head  PARTICIPATION LIMITATIONS: meal prep, cleaning, and laundry  PERSONAL FACTORS: Time since onset of injury/illness/exacerbation and 1 comorbidity: cervical dystonia are also affecting patient's functional outcome.   REHAB POTENTIAL: Good  CLINICAL DECISION MAKING: Stable/uncomplicated  EVALUATION COMPLEXITY: Low   GOALS:  SHORT TERM GOALS: Target date: 04/20/24.  Ind with an initial HEP. Goal status: MET   LONG TERM GOALS: Target date: 07/05/24.  Ind with an advanced HEP.  Goal status: IN PROGRESS  2.  Active right shoulder flexion to 145 degrees so the patient can easily reach overhead.  Goal status: IN PROGRESS  3.  Active ER to 70 degrees+ to allow for easily donning/doffing of apparel.  Goal status: IN PROGRESS  4.  Increase shoulder strength to a solid 4+/5 to increase stability for performance of functional activities.  Goal status: IN PROGRESS  5.  Perform ADL's with pain  not > 3/10.  Goal status: IN PROGRESS  PLAN:  PT FREQUENCY: 2x/week  PT DURATION: 6 weeks  PLANNED INTERVENTIONS: 97110-Therapeutic exercises, 97530- Therapeutic activity, [id number]- Neuromuscular re-education, 97535- Self Care, 02859- Manual therapy, G0283- Electrical stimulation (unattended), 97016- Vasopneumatic device, 97035- Ultrasound, Patient/Family education, and Cryotherapy  PLAN FOR NEXT SESSION: Progress per protocol:  Begin with gentle right shoulder P/AROM.  Vasopneumatic with pillow between thorax and elbow.    Progress Note Reporting Period 04/06/25 to 05/11/24  See note below for Objective Data and Assessment of Progress/Goals.  Patient progressing well toward goals in accord with protocol.      Trajan Grove, PT 05/11/2024, 2:13 PM  "

## 2024-05-13 ENCOUNTER — Ambulatory Visit: Admitting: Physical Therapy

## 2024-05-13 DIAGNOSIS — M25611 Stiffness of right shoulder, not elsewhere classified: Secondary | ICD-10-CM

## 2024-05-13 DIAGNOSIS — G8929 Other chronic pain: Secondary | ICD-10-CM

## 2024-05-13 DIAGNOSIS — M25511 Pain in right shoulder: Secondary | ICD-10-CM | POA: Diagnosis not present

## 2024-05-13 NOTE — Therapy (Signed)
 " OUTPATIENT PHYSICAL THERAPY SHOULDER TREATMENT  Patient Name: [name] MRN: [id number] DOB:07-20-1950, 74 y.o., female Today's Date: 05/13/2024  END OF SESSION:  PT End of Session - 05/13/24 1317     Visit Number 11    Number of Visits 12    Date for Recertification  05/18/24    PT Start Time 0100    PT Stop Time 0200    PT Time Calculation (min) 60 min    Activity Tolerance Patient tolerated treatment well    Behavior During Therapy WFL for tasks assessed/performed             Past Medical History:  Diagnosis Date   Anxiety    hx panic attacks;  no meds   Arthritis NECK AND SHOULDERS   Chronic facial pain SECONDARY TO NERVE PALSY--  TAKES GABAPENTIN    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   Frequency of urination    GERD (gastroesophageal reflux disease)    H/O hiatal hernia    History of kidney stones    Interstitial cystitis    Nocturia    PONV (postoperative nausea and vomiting)    Urgency of urination    Past Surgical History:  Procedure Laterality Date   ABDOMINAL HYSTERECTOMY  1987   AND BURCH PROCEDURE  (BLADDER SUSPENSION)   ATTEMPTED URETEROSCOPIC STONE EXTRACTION  2003   BLADDER SLING PROCEDURE  2005   BREAST REDUCTION SURGERY  2009   BILATERAL   BUNIONECTOMY  SEPT 2005   LEFT FOOT   EXTRACORPOREAL SHOCK WAVE LITHOTRIPSY  2002   RIGHT SIDE   LAPAROSCOPIC LYSIS OF ADHESIONS  03/03/2012   Procedure: LAPAROSCOPIC LYSIS OF ADHESIONS;  Surgeon: Alm JAYSON Cook, MD;  Location: [facility];  Service: Gynecology;  Laterality: N/A;   LAPAROSCOPY  03/03/2012   Procedure: LAPAROSCOPY OPERATIVE;  Surgeon: Alm JAYSON Cook, MD;  Location: [facility];  Service: Gynecology;  Laterality: N/A;   NASAL SINUS SURGERY  DEC 2009   PUBOVAGINAL SLING  04/05/2011   Procedure: CARLOYN GLADE;  Surgeon: Arlena LILLETTE Gal, MD;  Location: [facility];  Service: Urology;  Laterality: N/A;  ANTERIOR UPHOLD LITE   AND  CYSTOSCOPY   RECTOCELE REPAIR N/A 08/18/2012   Procedure: REPAIR POSTERIOR VAGINAL WALL SCAR AND VAGINOPLASTY ;  Surgeon: Arlena LILLETTE Gal, MD;  Location: [facility] ;  Service: Urology;  Laterality: N/A;   RIGHT SHOULDER SURGERY  DEC 2005   TUBAL LIGATION  1980   VAGINAL PROLAPSE REPAIR  03/03/2012   Procedure: VAGINAL VAULT SUSPENSION;  Surgeon: Arlena LILLETTE Gal, MD;  Location: [facility];  Service: Urology;  Laterality: N/A;  repair with xenform with ileococcygeus repair   Patient Active Problem List   Diagnosis Date Noted   SOB (shortness of breath) 11/04/2014   Constipation 01/08/2011   REFERRING PROVIDER: Elspeth Her MD  REFERRING DIAG: Tear of RTC s/p surgery.    THERAPY DIAG:  Chronic right shoulder pain  Stiffness of right shoulder, not elsewhere classified  Rationale for Evaluation and Treatment: Rehabilitation  ONSET DATE: Surgery date (04/01/24).    SUBJECTIVE:  SUBJECTIVE STATEMENT: Doing okay.  Can with right hand and brush teeth now.  PAIN:  Are you having pain? Yes: NPRS scale: 6/10 Pain location: Right shoulder. Pain description: Throbbing and sore.   Aggravating factors: Hanging down.   Relieving factors: Elevate  PRECAUTIONS: Other: Progress per protocol.    RED FLAGS: None   WEIGHT BEARING RESTRICTIONS: No right UE weight bearing.  FALLS:  Has patient fallen in last 6 months? Yes. Number of falls 1.  LIVING ENVIRONMENT: Lives with: lives with their family Lives in: House/apartment Has following equipment at home: None  OCCUPATION: Retired.  PLOF: Independent  PATIENT GOALS:  Use right shoulder without pain.    NEXT MD VISIT:   OBJECTIVE:   PATIENT SURVEYS:  QUICKDASH:  48 points (84%).     UPPER EXTREMITY ROM:  In  supine:  Gentle P/AROM flexion is 80 degrees and ER is 30 degrees.   PALPATION:  The patient c/o diffuse right shoulder pain.  Post-surgical dressing removed and steri-strips intact.  Ecchymosis present as expected.                                                                                                                               TREATMENT DATE:   05/13/24:                                       EXERCISE LOG  Exercise Repetitions and Resistance Comments  Pulleys 5 minutes    Wall ladder 5 minutes    Towel on wall 3 minutes    UE Ranger on wall 5 minutes    Swiss ball on wall (2 hands) 4 minutes    Gentle P/AROM into right shoulder flexion and ER x 16 minutes f/b vasopneumatic x 15 minutes on low with pillow between right elbow and thorax.    05/11/24:                                     EXERCISE LOG  Exercise Repetitions and Resistance Comments  Pulleys 6 minutes    Wall ladder 6 minutes (21).     Red Swiss ball on wall (both hands) 5 minutes   UE Ranger on wall  5 minutes         05/08/23:                                     EXERCISE LOG  Exercise Repetitions and Resistance Comments  Seated UE ranger  5 minutes    Pulleys  4 minutes   Blue Swiss ball on table 5 minutes    Supine bench press with cane 3 minutes   Supine flexion with cane 2 minutes  Supine flexion with cane diagonal  2 minutes   Gentle P/AROM into right shoulder flexion and ER x 17 minutes f/b vasopneumatic x 15 minutes on low with pillow between right elbow and thorax.      05/05/23                                  EXERCISE LOG  Exercise Repetitions and Resistance Comments  Pulleys 5 mins   UE Ranger 6 mins   Ball Roll Outs 5 mins   Supine Flexion 20 reps w cane   Chest Press 20 reps w cane   ER 20 reps w cane    Blank cell = exercise not performed today   Manual Therapy Soft Tissue Mobilization: Right shoulder, STW/M to right deltoid and bicep to decrease pain and tone with several  trigger points noted   Modalities  Date:  Vaso: Shoulder, 34 degrees; low pressure, 15 mins, Pain and Edema   04/29/24:  Seated UE Ranger x 6 minutes f/b Swiiss ball roll outs (ball on table) x 5 minutes then In supine:  Gentle P/AROM into right shoulder flexion and ER x 27 minutes f/b vasopneumatic x 15 minutes on low with pillow between right elbow and thorax.     PATIENT EDUCATION: Education details: See below. Person educated: Patient and Caregiver   Education method: Medical Illustrator Education comprehension: verbalized understanding, handout  HOME EXERCISE PROGRAM: CANE CANE SUPINE CANE  [[id number] ]  AAROM Shoulder Scaption -  Repeat 15 Repetitions, Hold 3 Seconds, Complete 2 Sets, Perform 3 Times a Day  Shoulder forward flexion with cane -  Repeat 10 Repetitions, Hold 5 Seconds, Perform 3 Times a Day  BENCH PRESS - UNWEIGHTED -  Repeat 10 Repetitions, Complete 3 Sets, Perform 3 Times a Day  ASSESSMENT:  CLINICAL IMPRESSION: Patient has been motivated during all her session.  She has been compliant to her sling usage and to her established HEP.  She is progressing very well per protocol.    OBJECTIVE IMPAIRMENTS: decreased activity tolerance, decreased ROM, and pain.   ACTIVITY LIMITATIONS: carrying, lifting, bending, bathing, dressing, and reach over head  PARTICIPATION LIMITATIONS: meal prep, cleaning, and laundry  PERSONAL FACTORS: Time since onset of injury/illness/exacerbation and 1 comorbidity: cervical dystonia are also affecting patient's functional outcome.   REHAB POTENTIAL: Good  CLINICAL DECISION MAKING: Stable/uncomplicated  EVALUATION COMPLEXITY: Low   GOALS:  SHORT TERM GOALS: Target date: 04/20/24.  Ind with an initial HEP. Goal status: MET   LONG TERM GOALS: Target date: 07/05/24.  Ind with an advanced HEP.  Goal status: IN PROGRESS  2.  Active right shoulder flexion to 145 degrees so the patient can easily reach overhead.   Goal status: IN PROGRESS  3.  Active ER to 70 degrees+ to allow for easily donning/doffing of apparel.  Goal status: IN PROGRESS  4.  Increase shoulder strength to a solid 4+/5 to increase stability for performance of functional activities.  Goal status: IN PROGRESS  5.  Perform ADL's with pain not > 3/10.  Goal status: IN PROGRESS  PLAN:  PT FREQUENCY: 2x/week  PT DURATION: 6 weeks  PLANNED INTERVENTIONS: 97110-Therapeutic exercises, 97530- Therapeutic activity, [id number]- Neuromuscular re-education, 97535- Self Care, 02859- Manual therapy, G0283- Electrical stimulation (unattended), 97016- Vasopneumatic device, 97035- Ultrasound, Patient/Family education, and Cryotherapy  PLAN FOR NEXT SESSION: Progress per protocol:  Begin with gentle right shoulder P/AROM.  Vasopneumatic with pillow between  thorax and elbow.         Winston Sobczyk, PT 05/13/2024, 2:22 PM  "

## 2024-05-14 ENCOUNTER — Ambulatory Visit: Admitting: Cardiovascular Disease

## 2024-05-17 DIAGNOSIS — R002 Palpitations: Secondary | ICD-10-CM

## 2024-05-17 DIAGNOSIS — R0602 Shortness of breath: Secondary | ICD-10-CM

## 2024-05-18 ENCOUNTER — Ambulatory Visit: Admitting: Physical Therapy

## 2024-05-18 DIAGNOSIS — M62838 Other muscle spasm: Secondary | ICD-10-CM

## 2024-05-18 DIAGNOSIS — M25611 Stiffness of right shoulder, not elsewhere classified: Secondary | ICD-10-CM

## 2024-05-18 DIAGNOSIS — G8929 Other chronic pain: Secondary | ICD-10-CM

## 2024-05-18 DIAGNOSIS — M25511 Pain in right shoulder: Secondary | ICD-10-CM | POA: Diagnosis not present

## 2024-05-18 NOTE — Therapy (Signed)
 " OUTPATIENT PHYSICAL THERAPY SHOULDER TREATMENT  Patient Name: [name] MRN: [id number] DOB:Apr 03, 1951, 74 y.o., female Today's Date: 05/18/2024  END OF SESSION:  PT End of Session - 05/18/24 1648     Visit Number 12    Number of Visits 12    Date for Recertification  06/15/24    PT Start Time 0321    PT Stop Time 0421    PT Time Calculation (min) 60 min    Activity Tolerance Patient tolerated treatment well    Behavior During Therapy WFL for tasks assessed/performed              Past Medical History:  Diagnosis Date   Anxiety    hx panic attacks;  no meds   Arthritis NECK AND SHOULDERS   Chronic facial pain SECONDARY TO NERVE PALSY--  TAKES GABAPENTIN    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   Frequency of urination    GERD (gastroesophageal reflux disease)    H/O hiatal hernia    History of kidney stones    Interstitial cystitis    Nocturia    PONV (postoperative nausea and vomiting)    Urgency of urination    Past Surgical History:  Procedure Laterality Date   ABDOMINAL HYSTERECTOMY  1987   AND BURCH PROCEDURE  (BLADDER SUSPENSION)   ATTEMPTED URETEROSCOPIC STONE EXTRACTION  2003   BLADDER SLING PROCEDURE  2005   BREAST REDUCTION SURGERY  2009   BILATERAL   BUNIONECTOMY  SEPT 2005   LEFT FOOT   EXTRACORPOREAL SHOCK WAVE LITHOTRIPSY  2002   RIGHT SIDE   LAPAROSCOPIC LYSIS OF ADHESIONS  03/03/2012   Procedure: LAPAROSCOPIC LYSIS OF ADHESIONS;  Surgeon: Alm JAYSON Cook, MD;  Location: [facility] [place];  Service: Gynecology;  Laterality: N/A;   LAPAROSCOPY  03/03/2012   Procedure: LAPAROSCOPY OPERATIVE;  Surgeon: Alm JAYSON Cook, MD;  Location: [facility];  Service: Gynecology;  Laterality: N/A;   NASAL SINUS SURGERY  DEC 2009   PUBOVAGINAL SLING  04/05/2011   Procedure: CARLOYN GLADE;  Surgeon: Arlena LILLETTE Gal, MD;  Location: [facility];  Service: Urology;  Laterality: N/A;  ANTERIOR UPHOLD LITE   AND  CYSTOSCOPY   RECTOCELE REPAIR N/A 08/18/2012   Procedure: REPAIR POSTERIOR VAGINAL WALL SCAR AND VAGINOPLASTY ;  Surgeon: Arlena LILLETTE Gal, MD;  Location: [facility];  Service: Urology;  Laterality: N/A;   RIGHT SHOULDER SURGERY  DEC 2005   TUBAL LIGATION  1980   VAGINAL PROLAPSE REPAIR  03/03/2012   Procedure: VAGINAL VAULT SUSPENSION;  Surgeon: Arlena LILLETTE Gal, MD;  Location: [facility];  Service: Urology;  Laterality: N/A;  repair with xenform with ileococcygeus repair   Patient Active Problem List   Diagnosis Date Noted   SOB (shortness of breath) 11/04/2014   Constipation 01/08/2011   REFERRING PROVIDER: Elspeth Her MD  REFERRING DIAG: Tear of RTC s/p surgery.    THERAPY DIAG:  Chronic right shoulder pain - Plan: PT plan of care cert/re-cert  Stiffness of right shoulder, not elsewhere classified - Plan: PT plan of care cert/re-cert  Other muscle spasm - Plan: PT plan of care cert/re-cert  Rationale for Evaluation and Treatment: Rehabilitation  ONSET DATE: Surgery date (04/01/24).    SUBJECTIVE:  SUBJECTIVE STATEMENT: Doctor said I was doing real good.  Abduction pillow removed.  Still in sling.  Sore.  Per MD note continue 2 times a week for 4 weeks.  Behind back and PRE's elbow close to side.   PAIN:  Are you having pain? Yes: NPRS scale: 5/10 Pain location: Right shoulder. Pain description: Throbbing and sore.   Aggravating factors: Hanging down.   Relieving factors: Elevate  PRECAUTIONS: Other: Progress per protocol.    RED FLAGS: None   WEIGHT BEARING RESTRICTIONS: No right UE weight bearing.  FALLS:  Has patient fallen in last 6 months? Yes. Number of falls 1.  LIVING ENVIRONMENT: Lives with: lives with their family Lives in:  House/apartment Has following equipment at home: None  OCCUPATION: Retired.  PLOF: Independent  PATIENT GOALS:  Use right shoulder without pain.    NEXT MD VISIT:   OBJECTIVE:   PATIENT SURVEYS:  QUICKDASH:  48 points (84%).     UPPER EXTREMITY ROM:  In supine:  Gentle P/AROM flexion is 80 degrees and ER is 30 degrees.   PALPATION:  The patient c/o diffuse right shoulder pain.  Post-surgical dressing removed and steri-strips intact.  Ecchymosis present as expected.                                                                                                                               TREATMENT DATE:   05/18/24:                                     EXERCISE LOG  Exercise Repetitions and Resistance Comments  Pulleys 5 minutes    Wall ladder 5 minutes    Doorway stretch  Slow 10 counts multiple reps   Behind back With strap gentle sustained long hold    UE ranger on wall.   4 minutes    Red swiss ball on wall (both hands) 4 minutes    Standing IR Yellow theraband 2 x 15    Standing ER Yellow theraband (minimal range) 2 x 15    Punches Yellow theraband (minimal range) 2 x 15   Extension Yellow theraband (minimal range) 2 x 15   LLLDS into right shoulder ER x 8 minutes f/b HMP and IFC at 80-150 Hz on 40% scan x 15 minutes.   Normal modality response following removal of modality.    05/13/24:                                       EXERCISE LOG  Exercise Repetitions and Resistance Comments  Pulleys 5 minutes    Wall ladder 5 minutes    Towel on wall 3 minutes    UE Ranger on wall 5 minutes    Swiss ball on wall (2 hands) 4  minutes    Gentle P/AROM into right shoulder flexion and ER x 16 minutes f/b vasopneumatic x 15 minutes on low with pillow between right elbow and thorax.    05/11/24:                                     EXERCISE LOG  Exercise Repetitions and Resistance Comments  Pulleys 6 minutes    Wall ladder 6 minutes (21).     Red Swiss ball on  wall (both hands) 5 minutes   UE Ranger on wall  5 minutes         05/08/23:                                     EXERCISE LOG  Exercise Repetitions and Resistance Comments  Seated UE ranger  5 minutes    Pulleys  4 minutes   Blue Swiss ball on table 5 minutes    Supine bench press with cane 3 minutes   Supine flexion with cane 2 minutes   Supine flexion with cane diagonal  2 minutes   Gentle P/AROM into right shoulder flexion and ER x 17 minutes f/b vasopneumatic x 15 minutes on low with pillow between right elbow and thorax.        PATIENT EDUCATION: Education details: See below. Person educated: Patient and Caregiver   Education method: Medical Illustrator Education comprehension: verbalized understanding, handout  HOME EXERCISE PROGRAM: HOME EXERCISE PROGRAM [[id number]]  90 degree Pec stretch -  Repeat 4 Repetitions, Hold 30 Seconds, Complete 2 Sets, Perform 4 Times a Day  INTERNAL ROTATION TOWEL STRETCH - IR TOWEL -  Repeat 10 Repetitions, Hold 20 Seconds, Complete 1 Set, Perform 4 Times a Day ASSESSMENT:  CLINICAL IMPRESSION: Patient did well with added interventions including doorway stretch to improve ER and behind back (gentle) with strap.  Her MD note to continue 2 times a week for 4 weeks.   OBJECTIVE IMPAIRMENTS: decreased activity tolerance, decreased ROM, and pain.   ACTIVITY LIMITATIONS: carrying, lifting, bending, bathing, dressing, and reach over head  PARTICIPATION LIMITATIONS: meal prep, cleaning, and laundry  PERSONAL FACTORS: Time since onset of injury/illness/exacerbation and 1 comorbidity: cervical dystonia are also affecting patient's functional outcome.   REHAB POTENTIAL: Good  CLINICAL DECISION MAKING: Stable/uncomplicated  EVALUATION COMPLEXITY: Low   GOALS:  SHORT TERM GOALS: Target date: 04/20/24.  Ind with an initial HEP. Goal status: MET   LONG TERM GOALS: Target date: 07/05/24.  Ind with an advanced HEP.  Goal status:  IN PROGRESS  2.  Active right shoulder flexion to 145 degrees so the patient can easily reach overhead.  Goal status: IN PROGRESS  3.  Active ER to 70 degrees+ to allow for easily donning/doffing of apparel.  Goal status: IN PROGRESS  4.  Increase shoulder strength to a solid 4+/5 to increase stability for performance of functional activities.  Goal status: IN PROGRESS  5.  Perform ADL's with pain not > 3/10.  Goal status: IN PROGRESS  PLAN:  PT FREQUENCY: 2x/week  PT DURATION: 6 weeks  PLANNED INTERVENTIONS: 97110-Therapeutic exercises, 97530- Therapeutic activity, [id number]- Neuromuscular re-education, 97535- Self Care, 02859- Manual therapy, G0283- Electrical stimulation (unattended), 97016- Vasopneumatic device, [id number]- Ultrasound, Patient/Family education, and Cryotherapy  PLAN FOR NEXT SESSION: 05/18/24:  Per MD note continue 2 times a  week for 4 weeks.  Behind back and PRE's elbow close to side.         Royer Cristobal, PT 05/18/2024, 4:55 PM  "

## 2024-05-20 ENCOUNTER — Ambulatory Visit

## 2024-05-20 DIAGNOSIS — G8929 Other chronic pain: Secondary | ICD-10-CM

## 2024-05-20 DIAGNOSIS — M25511 Pain in right shoulder: Secondary | ICD-10-CM

## 2024-05-20 DIAGNOSIS — M25611 Stiffness of right shoulder, not elsewhere classified: Secondary | ICD-10-CM

## 2024-05-20 DIAGNOSIS — M62838 Other muscle spasm: Secondary | ICD-10-CM

## 2024-05-20 MED ORDER — METOPROLOL SUCCINATE ER 25 MG PO TB24: 25.0000 mg | ORAL_TABLET | Freq: Every day | ORAL | 3 refills | Status: AC

## 2024-05-20 NOTE — Therapy (Signed)
 " OUTPATIENT PHYSICAL THERAPY SHOULDER TREATMENT  Patient Name: [name] MRN: [id number] DOB:1950-05-24, 74 y.o., female Today's Date: 05/20/2024  END OF SESSION:  PT End of Session - 05/20/24 1307     Visit Number 13    Number of Visits 20    Date for Recertification  07/13/24    PT Start Time 1300    PT Stop Time 1359    PT Time Calculation (min) 59 min    Activity Tolerance Patient tolerated treatment well    Behavior During Therapy WFL for tasks assessed/performed              Past Medical History:  Diagnosis Date   Anxiety    hx panic attacks;  no meds   Arthritis NECK AND SHOULDERS   Chronic facial pain SECONDARY TO NERVE PALSY--  TAKES GABAPENTIN    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   Frequency of urination    GERD (gastroesophageal reflux disease)    H/O hiatal hernia    History of kidney stones    Interstitial cystitis    Nocturia    PONV (postoperative nausea and vomiting)    Urgency of urination    Past Surgical History:  Procedure Laterality Date   ABDOMINAL HYSTERECTOMY  1987   AND BURCH PROCEDURE  (BLADDER SUSPENSION)   ATTEMPTED URETEROSCOPIC STONE EXTRACTION  2003   BLADDER SLING PROCEDURE  2005   BREAST REDUCTION SURGERY  2009   BILATERAL   BUNIONECTOMY  SEPT 2005   LEFT FOOT   EXTRACORPOREAL SHOCK WAVE LITHOTRIPSY  2002   RIGHT SIDE   LAPAROSCOPIC LYSIS OF ADHESIONS  03/03/2012   Procedure: LAPAROSCOPIC LYSIS OF ADHESIONS;  Surgeon: Alm JAYSON Cook, MD;  Location: [facility];  Service: Gynecology;  Laterality: N/A;   LAPAROSCOPY  03/03/2012   Procedure: LAPAROSCOPY OPERATIVE;  Surgeon: Alm JAYSON Cook, MD;  Location: [facility];  Service: Gynecology;  Laterality: N/A;   NASAL SINUS SURGERY  DEC 2009   PUBOVAGINAL SLING  04/05/2011   Procedure: CARLOYN GLADE;  Surgeon: Arlena LILLETTE Gal, MD;  Location: [facility];  Service: Urology;  Laterality: N/A;  ANTERIOR UPHOLD LITE   AND  CYSTOSCOPY   RECTOCELE REPAIR N/A 08/18/2012   Procedure: REPAIR POSTERIOR VAGINAL WALL SCAR AND VAGINOPLASTY ;  Surgeon: Arlena LILLETTE Gal, MD;  Location: [facility];  Service: Urology;  Laterality: N/A;   RIGHT SHOULDER SURGERY  DEC 2005   TUBAL LIGATION  1980   VAGINAL PROLAPSE REPAIR  03/03/2012   Procedure: VAGINAL VAULT SUSPENSION;  Surgeon: Arlena LILLETTE Gal, MD;  Location: [facility];  Service: Urology;  Laterality: N/A;  repair with xenform with ileococcygeus repair   Patient Active Problem List   Diagnosis Date Noted   SOB (shortness of breath) 11/04/2014   Constipation 01/08/2011   REFERRING PROVIDER: Elspeth Her MD  REFERRING DIAG: Tear of RTC s/p surgery.    THERAPY DIAG:  Chronic right shoulder pain  Stiffness of right shoulder, not elsewhere classified  Other muscle spasm  Acute pain of right shoulder  Rationale for Evaluation and Treatment: Rehabilitation  ONSET DATE: Surgery date (04/01/24).    SUBJECTIVE:  SUBJECTIVE STATEMENT: Pt report 4/10 right shoulder pain.    PAIN:  Are you having pain? Yes: NPRS scale: 4/10 Pain location: Right shoulder. Pain description: Throbbing and sore.   Aggravating factors: Hanging down.   Relieving factors: Elevate  PRECAUTIONS: Other: Progress per protocol.    RED FLAGS: None   WEIGHT BEARING RESTRICTIONS: No right UE weight bearing.  FALLS:  Has patient fallen in last 6 months? Yes. Number of falls 1.  LIVING ENVIRONMENT: Lives with: lives with their family Lives in: House/apartment Has following equipment at home: None  OCCUPATION: Retired.  PLOF: Independent  PATIENT GOALS:  Use right shoulder without pain.    NEXT MD VISIT:   OBJECTIVE:   PATIENT SURVEYS:  QUICKDASH:  48 points  (84%).     UPPER EXTREMITY ROM:  In supine:  Gentle P/AROM flexion is 80 degrees and ER is 30 degrees.   PALPATION:  The patient c/o diffuse right shoulder pain.  Post-surgical dressing removed and steri-strips intact.  Ecchymosis present as expected.                                                                                                                               TREATMENT DATE:   05/20/24:                                   EXERCISE LOG  Exercise Repetitions and Resistance Comments  Pulleys 6 minutes    Wall ladder 6 minutes    Doorway stretch     Behind back With strap gentle sustained long hold    UE ranger on wall.   6 minutes    Red swiss ball on wall (both hands) 6 minutes    Standing IR Yellow theraband 2 x 15    Standing ER Yellow theraband (minimal range) 2 x 15    Punches Yellow theraband (minimal range) 2 x 15   Extension Yellow theraband (minimal range) 2 x 15   HMP and IFC at 80-150 Hz on 40% scan x 15 minutes.   Normal modality response following removal of modality.    05/13/24:                                       EXERCISE LOG  Exercise Repetitions and Resistance Comments  Pulleys 5 minutes    Wall ladder 5 minutes    Towel on wall 3 minutes    UE Ranger on wall 5 minutes    Swiss ball on wall (2 hands) 4 minutes    Gentle P/AROM into right shoulder flexion and ER x 16 minutes f/b vasopneumatic x 15 minutes on low with pillow between right elbow and thorax.    05/11/24:  EXERCISE LOG  Exercise Repetitions and Resistance Comments  Pulleys 6 minutes    Wall ladder 6 minutes (21).     Red Swiss ball on wall (both hands) 5 minutes   UE Ranger on wall  5 minutes         05/08/23:                                   EXERCISE LOG  Exercise Repetitions and Resistance Comments  Seated UE ranger  5 minutes    Pulleys  4 minutes   Blue Swiss ball on table 5 minutes    Supine bench press with cane 3 minutes    Supine flexion with cane 2 minutes   Supine flexion with cane diagonal  2 minutes   Gentle P/AROM into right shoulder flexion and ER x 17 minutes f/b vasopneumatic x 15 minutes on low with pillow between right elbow and thorax.    PATIENT EDUCATION: Education details: See below. Person educated: Patient and Caregiver   Education method: Medical Illustrator Education comprehension: verbalized understanding, handout  HOME EXERCISE PROGRAM: HOME EXERCISE PROGRAM [[id number]]  90 degree Pec stretch -  Repeat 4 Repetitions, Hold 30 Seconds, Complete 2 Sets, Perform 4 Times a Day  INTERNAL ROTATION TOWEL STRETCH - IR TOWEL -  Repeat 10 Repetitions, Hold 20 Seconds, Complete 1 Set, Perform 4 Times a Day  [email or web address]  Access Code: [email or web address]  ASSESSMENT:  CLINICAL IMPRESSION: Pt arrives for today's treatment session reporting 4/10 right shoulder pain today.  Pt able to tolerate increased time or reps with all previously performed exercises.  Pt given yellow tband and update HEP for home use.  Normal responses to estim and Mh noted upon removal.  Pt reported decreased pain at completion of today's treatment session.   OBJECTIVE IMPAIRMENTS: decreased activity tolerance, decreased ROM, and pain.   ACTIVITY LIMITATIONS: carrying, lifting, bending, bathing, dressing, and reach over head  PARTICIPATION LIMITATIONS: meal prep, cleaning, and laundry  PERSONAL FACTORS: Time since onset of injury/illness/exacerbation and 1 comorbidity: cervical dystonia are also affecting patient's functional outcome.   REHAB POTENTIAL: Good  CLINICAL DECISION MAKING: Stable/uncomplicated  EVALUATION COMPLEXITY: Low   GOALS:  SHORT TERM GOALS: Target date: 04/20/24.  Ind with an initial HEP. Goal status: MET   LONG TERM GOALS: Target date: 07/05/24.  Ind with an advanced HEP.  Goal status: IN PROGRESS  2.  Active right shoulder flexion to 145 degrees so the patient  can easily reach overhead.  Goal status: IN PROGRESS  3.  Active ER to 70 degrees+ to allow for easily donning/doffing of apparel.  Goal status: IN PROGRESS  4.  Increase shoulder strength to a solid 4+/5 to increase stability for performance of functional activities.  Goal status: IN PROGRESS  5.  Perform ADL's with pain not > 3/10.  Goal status: IN PROGRESS  PLAN:  PT FREQUENCY: 2x/week  PT DURATION: 6 weeks  PLANNED INTERVENTIONS: 97110-Therapeutic exercises, 97530- Therapeutic activity, [id number]- Neuromuscular re-education, 97535- Self Care, 02859- Manual therapy, G0283- Electrical stimulation (unattended), 97016- Vasopneumatic device, [id number]- Ultrasound, Patient/Family education, and Cryotherapy  PLAN FOR NEXT SESSION: 05/18/24:  Per MD note continue 2 times a week for 4 weeks.  Behind back and PRE's elbow close to side.         Delon DELENA Gosling, PTA 05/20/2024, 2:41 PM  "

## 2024-05-25 ENCOUNTER — Ambulatory Visit: Payer: Self-pay

## 2024-05-27 ENCOUNTER — Ambulatory Visit: Payer: Self-pay | Admitting: Physical Therapy

## 2024-05-27 DIAGNOSIS — G8929 Other chronic pain: Secondary | ICD-10-CM

## 2024-05-27 DIAGNOSIS — M25511 Pain in right shoulder: Secondary | ICD-10-CM | POA: Diagnosis not present

## 2024-05-27 DIAGNOSIS — M25611 Stiffness of right shoulder, not elsewhere classified: Secondary | ICD-10-CM

## 2024-05-27 DIAGNOSIS — M62838 Other muscle spasm: Secondary | ICD-10-CM

## 2024-05-27 NOTE — Therapy (Addendum)
 " OUTPATIENT PHYSICAL THERAPY SHOULDER TREATMENT  Patient Name: [name] MRN: [id number] DOB:Sep 05, 1950, 74 y.o., female Today's Date: 05/27/2024  END OF SESSION:  PT End of Session - 05/27/24 1238     Visit Number 13    Number of Visits 20    Date for Recertification  07/13/24    PT Start Time 1114    PT Stop Time 1218    PT Time Calculation (min) 64 min    Activity Tolerance Patient tolerated treatment well    Behavior During Therapy WFL for tasks assessed/performed               Past Medical History:  Diagnosis Date   Anxiety    hx panic attacks;  no meds   Arthritis NECK AND SHOULDERS   Chronic facial pain SECONDARY TO NERVE PALSY--  TAKES GABAPENTIN    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   Frequency of urination    GERD (gastroesophageal reflux disease)    H/O hiatal hernia    History of kidney stones    Interstitial cystitis    Nocturia    PONV (postoperative nausea and vomiting)    Urgency of urination    Past Surgical History:  Procedure Laterality Date   ABDOMINAL HYSTERECTOMY  1987   AND BURCH PROCEDURE  (BLADDER SUSPENSION)   ATTEMPTED URETEROSCOPIC STONE EXTRACTION  2003   BLADDER SLING PROCEDURE  2005   BREAST REDUCTION SURGERY  2009   BILATERAL   BUNIONECTOMY  SEPT 2005   LEFT FOOT   EXTRACORPOREAL SHOCK WAVE LITHOTRIPSY  2002   RIGHT SIDE   LAPAROSCOPIC LYSIS OF ADHESIONS  03/03/2012   Procedure: LAPAROSCOPIC LYSIS OF ADHESIONS;  Surgeon: Alm JAYSON Cook, MD;  Location: [facility];  Service: Gynecology;  Laterality: N/A;   LAPAROSCOPY  03/03/2012   Procedure: LAPAROSCOPY OPERATIVE;  Surgeon: Alm JAYSON Cook, MD;  Location: [facility];  Service: Gynecology;  Laterality: N/A;   NASAL SINUS SURGERY  DEC 2009   PUBOVAGINAL SLING  04/05/2011   Procedure: CARLOYN GLADE;  Surgeon: Arlena LILLETTE Gal, MD;  Location: [facility];  Service: Urology;  Laterality: N/A;  ANTERIOR UPHOLD LITE    AND CYSTOSCOPY   RECTOCELE REPAIR N/A 08/18/2012   Procedure: REPAIR POSTERIOR VAGINAL WALL SCAR AND VAGINOPLASTY ;  Surgeon: Arlena LILLETTE Gal, MD;  Location: [facility] ;  Service: Urology;  Laterality: N/A;   RIGHT SHOULDER SURGERY  DEC 2005   TUBAL LIGATION  1980   VAGINAL PROLAPSE REPAIR  03/03/2012   Procedure: VAGINAL VAULT SUSPENSION;  Surgeon: Arlena LILLETTE Gal, MD;  Location: [facility];  Service: Urology;  Laterality: N/A;  repair with xenform with ileococcygeus repair   Patient Active Problem List   Diagnosis Date Noted   SOB (shortness of breath) 11/04/2014   Constipation 01/08/2011   REFERRING PROVIDER: Elspeth Her MD  REFERRING DIAG: Tear of RTC s/p surgery.    THERAPY DIAG:  Chronic right shoulder pain  Stiffness of right shoulder, not elsewhere classified  Other muscle spasm  Rationale for Evaluation and Treatment: Rehabilitation  ONSET DATE: Surgery date (04/01/24).    SUBJECTIVE:  SUBJECTIVE STATEMENT: Pt report 4/10 right shoulder pain.    PAIN:  Are you having pain? Yes: NPRS scale: 4/10 Pain location: Right shoulder. Pain description: Throbbing and sore.   Aggravating factors: Hanging down.   Relieving factors: Elevate  PRECAUTIONS: Other: Progress per protocol.    RED FLAGS: None   WEIGHT BEARING RESTRICTIONS: No right UE weight bearing.  FALLS:  Has patient fallen in last 6 months? Yes. Number of falls 1.  LIVING ENVIRONMENT: Lives with: lives with their family Lives in: House/apartment Has following equipment at home: None  OCCUPATION: Retired.  PLOF: Independent  PATIENT GOALS:  Use right shoulder without pain.    NEXT MD VISIT:   OBJECTIVE:   PATIENT SURVEYS:  QUICKDASH:  48 points (84%).     UPPER EXTREMITY  ROM:  In supine:  Gentle P/AROM flexion is 80 degrees and ER is 30 degrees.   PALPATION:  The patient c/o diffuse right shoulder pain.  Post-surgical dressing removed and steri-strips intact.  Ecchymosis present as expected.                                                                                                                               TREATMENT DATE:   05/27/24:                                     EXERCISE LOG  Exercise Repetitions and Resistance Comments  Pulleys 6 minutes   Swiss ball on table 6 minutes   Wall ladder 6 minutes   UE Ranger  6 minutes    In supine cane bench press 2 x 10   In supine full flexion with cane  2 x10    Full can in supine 2 x 10   PROM x 8 minutes f/b HMP and IFC at 80-150 Hz on 40% scan x 15 minutes.   Normal modality response following removal of modality.    05/20/24:                                   EXERCISE LOG  Exercise Repetitions and Resistance Comments  Pulleys 6 minutes    Wall ladder 6 minutes    Doorway stretch     Behind back With strap gentle sustained long hold    UE ranger on wall.   6 minutes    Red swiss ball on wall (both hands) 6 minutes    Standing IR Yellow theraband 2 x 15    Standing ER Yellow theraband (minimal range) 2 x 15    Punches Yellow theraband (minimal range) 2 x 15   Extension Yellow theraband (minimal range) 2 x 15   HMP and IFC at 80-150 Hz on 40% scan x 15 minutes.   Normal modality response following removal of modality.  05/13/24:                                       EXERCISE LOG  Exercise Repetitions and Resistance Comments  Pulleys 5 minutes    Wall ladder 5 minutes    Towel on wall 3 minutes    UE Ranger on wall 5 minutes    Swiss ball on wall (2 hands) 4 minutes    Gentle P/AROM into right shoulder flexion and ER x 16 minutes f/b vasopneumatic x 15 minutes on low with pillow between right elbow and thorax.    05/11/24:                                     EXERCISE  LOG  Exercise Repetitions and Resistance Comments  Pulleys 6 minutes    Wall ladder 6 minutes (21).     Red Swiss ball on wall (both hands) 5 minutes   UE Ranger on wall  5 minutes         05/08/23:                                   EXERCISE LOG  Exercise Repetitions and Resistance Comments  Seated UE ranger  5 minutes    Pulleys  4 minutes   Blue Swiss ball on table 5 minutes    Supine bench press with cane 3 minutes   Supine flexion with cane 2 minutes   Supine flexion with cane diagonal  2 minutes   Gentle P/AROM into right shoulder flexion and ER x 17 minutes f/b vasopneumatic x 15 minutes on low with pillow between right elbow and thorax.    PATIENT EDUCATION: Education details: See below. Person educated: Patient and Caregiver   Education method: Medical Illustrator Education comprehension: verbalized understanding, handout  HOME EXERCISE PROGRAM:  HOME EXERCISE PROGRAM [[id number]]  Supine shoulder flexion -  Repeat 10 Repetitions, Complete 2 Sets, Perform 3 Times a Day  ASSESSMENT:  CLINICAL IMPRESSION: Patient progressing well.  Added supine full can she she was able to perform through her full available range of motion.  OBJECTIVE IMPAIRMENTS: decreased activity tolerance, decreased ROM, and pain.   ACTIVITY LIMITATIONS: carrying, lifting, bending, bathing, dressing, and reach over head  PARTICIPATION LIMITATIONS: meal prep, cleaning, and laundry  PERSONAL FACTORS: Time since onset of injury/illness/exacerbation and 1 comorbidity: cervical dystonia are also affecting patient's functional outcome.   REHAB POTENTIAL: Good  CLINICAL DECISION MAKING: Stable/uncomplicated  EVALUATION COMPLEXITY: Low   GOALS:  SHORT TERM GOALS: Target date: 04/20/24.  Ind with an initial HEP. Goal status: MET   LONG TERM GOALS: Target date: 07/05/24.  Ind with an advanced HEP.  Goal status: IN PROGRESS  2.  Active right shoulder flexion to 145 degrees so the  patient can easily reach overhead.  Goal status: IN PROGRESS  3.  Active ER to 70 degrees+ to allow for easily donning/doffing of apparel.  Goal status: IN PROGRESS  4.  Increase shoulder strength to a solid 4+/5 to increase stability for performance of functional activities.  Goal status: IN PROGRESS  5.  Perform ADL's with pain not > 3/10.  Goal status: IN PROGRESS  PLAN:  PT FREQUENCY: 2x/week  PT DURATION: 6 weeks  PLANNED  INTERVENTIONS: 97110-Therapeutic exercises, 97530- Therapeutic activity, [id number]- Neuromuscular re-education, 97535- Self Care, 02859- Manual therapy, G0283- Electrical stimulation (unattended), 97016- Vasopneumatic device, [id number]- Ultrasound, Patient/Family education, and Cryotherapy  PLAN FOR NEXT SESSION: 05/18/24:  Per MD note continue 2 times a week for 4 weeks.  Behind back and PRE's elbow close to side.         Aizen Duval, PT 05/27/2024, 12:52 PM  "

## 2024-05-28 ENCOUNTER — Ambulatory Visit (HOSPITAL_COMMUNITY)
Admission: RE | Admit: 2024-05-28 | Discharge: 2024-05-28 | Disposition: A | Discharge status: Home / Self Care | Source: Ambulatory Visit | Attending: Cardiovascular Disease | Admitting: Cardiovascular Disease

## 2024-05-28 DIAGNOSIS — R0602 Shortness of breath: Secondary | ICD-10-CM | POA: Diagnosis present

## 2024-05-28 DIAGNOSIS — R0789 Other chest pain: Secondary | ICD-10-CM | POA: Diagnosis present

## 2024-05-28 DIAGNOSIS — R002 Palpitations: Secondary | ICD-10-CM | POA: Insufficient documentation

## 2024-05-28 LAB — ECHOCARDIOGRAM COMPLETE
Area-P 1/2: 3.81 cm2
S' Lateral: 2.2 cm

## 2024-06-03 ENCOUNTER — Ambulatory Visit: Payer: Self-pay

## 2024-06-03 DIAGNOSIS — G8929 Other chronic pain: Secondary | ICD-10-CM

## 2024-06-03 DIAGNOSIS — M62838 Other muscle spasm: Secondary | ICD-10-CM

## 2024-06-03 DIAGNOSIS — M25611 Stiffness of right shoulder, not elsewhere classified: Secondary | ICD-10-CM

## 2024-06-03 DIAGNOSIS — M25511 Pain in right shoulder: Secondary | ICD-10-CM

## 2024-06-03 NOTE — Therapy (Signed)
 " OUTPATIENT PHYSICAL THERAPY SHOULDER TREATMENT  Patient Name: [name] MRN: [id number] DOB:11-26-1950, 74 y.o., female Today's Date: 06/03/2024  END OF SESSION:  PT End of Session - 06/03/24 1017     Visit Number 14    Number of Visits 20    Date for Recertification  07/13/24    PT Start Time 1015    PT Stop Time 1119    PT Time Calculation (min) 64 min    Activity Tolerance Patient tolerated treatment well    Behavior During Therapy WFL for tasks assessed/performed               Past Medical History:  Diagnosis Date   Anxiety    hx panic attacks;  no meds   Arthritis NECK AND SHOULDERS   Chronic facial pain SECONDARY TO NERVE PALSY--  TAKES GABAPENTIN    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   Frequency of urination    GERD (gastroesophageal reflux disease)    H/O hiatal hernia    History of kidney stones    Interstitial cystitis    Nocturia    PONV (postoperative nausea and vomiting)    Urgency of urination    Past Surgical History:  Procedure Laterality Date   ABDOMINAL HYSTERECTOMY  1987   AND BURCH PROCEDURE  (BLADDER SUSPENSION)   ATTEMPTED URETEROSCOPIC STONE EXTRACTION  2003   BLADDER SLING PROCEDURE  2005   BREAST REDUCTION SURGERY  2009   BILATERAL   BUNIONECTOMY  SEPT 2005   LEFT FOOT   EXTRACORPOREAL SHOCK WAVE LITHOTRIPSY  2002   RIGHT SIDE   LAPAROSCOPIC LYSIS OF ADHESIONS  03/03/2012   Procedure: LAPAROSCOPIC LYSIS OF ADHESIONS;  Surgeon: Alm JAYSON Cook, MD;  Location: [facility];  Service: Gynecology;  Laterality: N/A;   LAPAROSCOPY  03/03/2012   Procedure: LAPAROSCOPY OPERATIVE;  Surgeon: Alm JAYSON Cook, MD;  Location: [facility];  Service: Gynecology;  Laterality: N/A;   NASAL SINUS SURGERY  DEC 2009   PUBOVAGINAL SLING  04/05/2011   Procedure: CARLOYN GLADE;  Surgeon: Arlena LILLETTE Gal, MD;  Location: [facility];  Service: Urology;  Laterality: N/A;  ANTERIOR UPHOLD LITE    AND CYSTOSCOPY   RECTOCELE REPAIR N/A 08/18/2012   Procedure: REPAIR POSTERIOR VAGINAL WALL SCAR AND VAGINOPLASTY ;  Surgeon: Arlena LILLETTE Gal, MD;  Location: [facility];  Service: Urology;  Laterality: N/A;   RIGHT SHOULDER SURGERY  DEC 2005   TUBAL LIGATION  1980   VAGINAL PROLAPSE REPAIR  03/03/2012   Procedure: VAGINAL VAULT SUSPENSION;  Surgeon: Arlena LILLETTE Gal, MD;  Location: [facility];  Service: Urology;  Laterality: N/A;  repair with xenform with ileococcygeus repair   Patient Active Problem List   Diagnosis Date Noted   SOB (shortness of breath) 11/04/2014   Constipation 01/08/2011   REFERRING PROVIDER: Elspeth Her MD  REFERRING DIAG: Tear of RTC s/p surgery.    THERAPY DIAG:  Chronic right shoulder pain  Stiffness of right shoulder, not elsewhere classified  Other muscle spasm  Acute pain of right shoulder  Rationale for Evaluation and Treatment: Rehabilitation  ONSET DATE: Surgery date (04/01/24).    SUBJECTIVE:  SUBJECTIVE STATEMENT: Pt report 5/10 right shoulder pain.  Pt reports increased stiffness today.   PAIN:  Are you having pain? Yes: NPRS scale: 5/10 Pain location: Right shoulder. Pain description: Throbbing and sore.   Aggravating factors: Hanging down.   Relieving factors: Elevate  PRECAUTIONS: Other: Progress per protocol.    RED FLAGS: None   WEIGHT BEARING RESTRICTIONS: No right UE weight bearing.  FALLS:  Has patient fallen in last 6 months? Yes. Number of falls 1.  LIVING ENVIRONMENT: Lives with: lives with their family Lives in: House/apartment Has following equipment at home: None  OCCUPATION: Retired.  PLOF: Independent  PATIENT GOALS:  Use right shoulder without pain.    NEXT MD VISIT:   OBJECTIVE:    PATIENT SURVEYS:  QUICKDASH:  48 points (84%).     UPPER EXTREMITY ROM:  In supine:  Gentle P/AROM flexion is 80 degrees and ER is 30 degrees.   PALPATION:  The patient c/o diffuse right shoulder pain.  Post-surgical dressing removed and steri-strips intact.  Ecchymosis present as expected.                                                                                                                               TREATMENT DATE:   06/03/24:                                   EXERCISE LOG  Exercise Repetitions and Resistance Comments  Pulleys 6 minutes   Swiss ball on wall 3 minutes   Wall ladder 6 minutes   UE Ranger  6 minutes    Chest press 2 x 10   Flexion 2 x10    ER 2 x 10   Rows Yellow 2 sets of 20 reps   Extensions Yellow 2 sets of 20 reps    HMP and IFC at 80-150 Hz on 40% scan x 15 minutes.   Normal modality response following removal of modality.    05/20/24:                                   EXERCISE LOG  Exercise Repetitions and Resistance Comments  Pulleys 6 minutes    Wall ladder 6 minutes    Doorway stretch     Behind back With strap gentle sustained long hold    UE ranger on wall.   6 minutes    Red swiss ball on wall (both hands) 6 minutes    Standing IR Yellow theraband 2 x 15    Standing ER Yellow theraband (minimal range) 2 x 15    Punches Yellow theraband (minimal range) 2 x 15   Extension Yellow theraband (minimal range) 2 x 15   HMP and IFC at 80-150 Hz on 40% scan x 15 minutes.   Normal modality response  following removal of modality.    05/13/24:                                       EXERCISE LOG  Exercise Repetitions and Resistance Comments  Pulleys 5 minutes    Wall ladder 5 minutes    Towel on wall 3 minutes    UE Ranger on wall 5 minutes    Swiss ball on wall (2 hands) 4 minutes    Gentle P/AROM into right shoulder flexion and ER x 16 minutes f/b vasopneumatic x 15 minutes on low with pillow between right elbow and thorax.     05/11/24:                                     EXERCISE LOG  Exercise Repetitions and Resistance Comments  Pulleys 6 minutes    Wall ladder 6 minutes (21).     Red Swiss ball on wall (both hands) 5 minutes   UE Ranger on wall  5 minutes         05/08/23:                                   EXERCISE LOG  Exercise Repetitions and Resistance Comments  Seated UE ranger  5 minutes    Pulleys  4 minutes   Blue Swiss ball on table 5 minutes    Supine bench press with cane 3 minutes   Supine flexion with cane 2 minutes   Supine flexion with cane diagonal  2 minutes   Gentle P/AROM into right shoulder flexion and ER x 17 minutes f/b vasopneumatic x 15 minutes on low with pillow between right elbow and thorax.    PATIENT EDUCATION: Education details: See below. Person educated: Patient and Caregiver   Education method: Medical Illustrator Education comprehension: verbalized understanding, handout  HOME EXERCISE PROGRAM:  HOME EXERCISE PROGRAM [[id number]]  Supine shoulder flexion -  Repeat 10 Repetitions, Complete 2 Sets, Perform 3 Times a Day  ASSESSMENT:  CLINICAL IMPRESSION: Pt arrives for today's treatment session reporting 3/10 right shoulder pain.  Pt able to transition to seated AA exercises today with min cues for proper technique and posture required.  Pt also able to transition to physioball on the wall versus the table.  Pt has MD appointment next week.  Normal responses to estim and MH noted upon removal.  Pt reported decreased pain at completion of today's treatment session.   OBJECTIVE IMPAIRMENTS: decreased activity tolerance, decreased ROM, and pain.   ACTIVITY LIMITATIONS: carrying, lifting, bending, bathing, dressing, and reach over head  PARTICIPATION LIMITATIONS: meal prep, cleaning, and laundry  PERSONAL FACTORS: Time since onset of injury/illness/exacerbation and 1 comorbidity: cervical dystonia are also affecting patient's functional outcome.   REHAB  POTENTIAL: Good  CLINICAL DECISION MAKING: Stable/uncomplicated  EVALUATION COMPLEXITY: Low   GOALS:  SHORT TERM GOALS: Target date: 04/20/24.  Ind with an initial HEP. Goal status: MET   LONG TERM GOALS: Target date: 07/05/24.  Ind with an advanced HEP.  Goal status: IN PROGRESS  2.  Active right shoulder flexion to 145 degrees so the patient can easily reach overhead.  Goal status: IN PROGRESS  3.  Active ER to 70 degrees+ to allow for easily  donning/doffing of apparel.  Goal status: IN PROGRESS  4.  Increase shoulder strength to a solid 4+/5 to increase stability for performance of functional activities.  Goal status: IN PROGRESS  5.  Perform ADL's with pain not > 3/10.  Goal status: IN PROGRESS  PLAN:  PT FREQUENCY: 2x/week  PT DURATION: 6 weeks  PLANNED INTERVENTIONS: 97110-Therapeutic exercises, 97530- Therapeutic activity, [id number]- Neuromuscular re-education, 97535- Self Care, [id number]- Manual therapy, G0283- Electrical stimulation (unattended), 97016- Vasopneumatic device, [id number]- Ultrasound, Patient/Family education, and Cryotherapy  PLAN FOR NEXT SESSION: 05/18/24:  Per MD note continue 2 times a week for 4 weeks.  Behind back and PRE's elbow close to side.         Delon DELENA Gosling, PTA 06/03/2024, 11:29 AM  "

## 2024-06-05 ENCOUNTER — Ambulatory Visit: Payer: Self-pay

## 2024-06-05 DIAGNOSIS — M25511 Pain in right shoulder: Secondary | ICD-10-CM

## 2024-06-05 DIAGNOSIS — M62838 Other muscle spasm: Secondary | ICD-10-CM

## 2024-06-05 DIAGNOSIS — M25611 Stiffness of right shoulder, not elsewhere classified: Secondary | ICD-10-CM

## 2024-06-05 DIAGNOSIS — G8929 Other chronic pain: Secondary | ICD-10-CM

## 2024-06-05 NOTE — Therapy (Signed)
 " OUTPATIENT PHYSICAL THERAPY SHOULDER TREATMENT  Patient Name: [name] MRN: [id number] DOB:16-Apr-1951, 74 y.o., female Today's Date: 06/05/2024  END OF SESSION:  PT End of Session - 06/05/24 1022     Visit Number 15    Number of Visits 20    Date for Recertification  07/13/24    PT Start Time 1015    PT Stop Time 1114    PT Time Calculation (min) 59 min    Activity Tolerance Patient tolerated treatment well    Behavior During Therapy WFL for tasks assessed/performed               Past Medical History:  Diagnosis Date   Anxiety    hx panic attacks;  no meds   Arthritis NECK AND SHOULDERS   Chronic facial pain SECONDARY TO NERVE PALSY--  TAKES GABAPENTIN    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   Frequency of urination    GERD (gastroesophageal reflux disease)    H/O hiatal hernia    History of kidney stones    Interstitial cystitis    Nocturia    PONV (postoperative nausea and vomiting)    Urgency of urination    Past Surgical History:  Procedure Laterality Date   ABDOMINAL HYSTERECTOMY  1987   AND BURCH PROCEDURE  (BLADDER SUSPENSION)   ATTEMPTED URETEROSCOPIC STONE EXTRACTION  2003   BLADDER SLING PROCEDURE  2005   BREAST REDUCTION SURGERY  2009   BILATERAL   BUNIONECTOMY  SEPT 2005   LEFT FOOT   EXTRACORPOREAL SHOCK WAVE LITHOTRIPSY  2002   RIGHT SIDE   LAPAROSCOPIC LYSIS OF ADHESIONS  03/03/2012   Procedure: LAPAROSCOPIC LYSIS OF ADHESIONS;  Surgeon: Alm JAYSON Cook, MD;  Location: [facility];  Service: Gynecology;  Laterality: N/A;   LAPAROSCOPY  03/03/2012   Procedure: LAPAROSCOPY OPERATIVE;  Surgeon: Alm JAYSON Cook, MD;  Location: [facility];  Service: Gynecology;  Laterality: N/A;   NASAL SINUS SURGERY  DEC 2009   PUBOVAGINAL SLING  04/05/2011   Procedure: CARLOYN GLADE;  Surgeon: Arlena LILLETTE Gal, MD;  Location: [facility];  Service: Urology;  Laterality: N/A;  ANTERIOR UPHOLD LITE    AND CYSTOSCOPY   RECTOCELE REPAIR N/A 08/18/2012   Procedure: REPAIR POSTERIOR VAGINAL WALL SCAR AND VAGINOPLASTY ;  Surgeon: Arlena LILLETTE Gal, MD;  Location: [facility];  Service: Urology;  Laterality: N/A;   RIGHT SHOULDER SURGERY  DEC 2005   TUBAL LIGATION  1980   VAGINAL PROLAPSE REPAIR  03/03/2012   Procedure: VAGINAL VAULT SUSPENSION;  Surgeon: Arlena LILLETTE Gal, MD;  Location: [facility];  Service: Urology;  Laterality: N/A;  repair with xenform with ileococcygeus repair   Patient Active Problem List   Diagnosis Date Noted   SOB (shortness of breath) 11/04/2014   Constipation 01/08/2011   REFERRING PROVIDER: Elspeth Her MD  REFERRING DIAG: Tear of RTC s/p surgery.    THERAPY DIAG:  Chronic right shoulder pain  Stiffness of right shoulder, not elsewhere classified  Other muscle spasm  Acute pain of right shoulder  Rationale for Evaluation and Treatment: Rehabilitation  ONSET DATE: Surgery date (04/01/24).    SUBJECTIVE:  SUBJECTIVE STATEMENT: Pt report 3/10 right shoulder pain.  Pt reports increased soreness today.   PAIN:  Are you having pain? Yes: NPRS scale: 3/10 Pain location: Right shoulder. Pain description: Throbbing and sore.   Aggravating factors: Hanging down.   Relieving factors: Elevate  PRECAUTIONS: Other: Progress per protocol.    RED FLAGS: None   WEIGHT BEARING RESTRICTIONS: No right UE weight bearing.  FALLS:  Has patient fallen in last 6 months? Yes. Number of falls 1.  LIVING ENVIRONMENT: Lives with: lives with their family Lives in: House/apartment Has following equipment at home: None  OCCUPATION: Retired.  PLOF: Independent  PATIENT GOALS:  Use right shoulder without pain.    NEXT MD VISIT:   OBJECTIVE:    PATIENT SURVEYS:  QUICKDASH:  48 points (84%).     UPPER EXTREMITY ROM:  In supine:  Gentle P/AROM flexion is 80 degrees and ER is 30 degrees.   PALPATION:  The patient c/o diffuse right shoulder pain.  Post-surgical dressing removed and steri-strips intact.  Ecchymosis present as expected.                                                                                                                               TREATMENT DATE:   06/05/24:                                   EXERCISE LOG  Exercise Repetitions and Resistance Comments  Pulleys 6 minutes   Swiss ball on wall 3 minutes   Wall ladder 6 minutes   UE Ranger  6 minutes    Chest press    Flexion    ER    Rows    Extensions     Manual Therapy Soft Tissue Mobilization: Right shoulder, STW/M to right deltoid and bicep to decrease pain and tone with pt seated in chair   HMP and IFC at 80-150 Hz on 40% scan x 15 minutes.   Normal modality response following removal of modality.    05/20/24:                                   EXERCISE LOG  Exercise Repetitions and Resistance Comments  Pulleys 6 minutes    Wall ladder 6 minutes    Doorway stretch     Behind back With strap gentle sustained long hold    UE ranger on wall.   6 minutes    Red swiss ball on wall (both hands) 6 minutes    Standing IR Yellow theraband 2 x 15    Standing ER Yellow theraband (minimal range) 2 x 15    Punches Yellow theraband (minimal range) 2 x 15   Extension Yellow theraband (minimal range) 2 x 15   HMP and IFC at 80-150 Hz on 40%  scan x 15 minutes.   Normal modality response following removal of modality.    05/13/24:                      EXERCISE LOG  Exercise Repetitions and Resistance Comments  Pulleys 5 minutes    Wall ladder 5 minutes    Towel on wall 3 minutes    UE Ranger on wall 5 minutes    Swiss ball on wall (2 hands) 4 minutes    Gentle P/AROM into right shoulder flexion and ER x 16 minutes f/b vasopneumatic x 15  minutes on low with pillow between right elbow and thorax.    PATIENT EDUCATION: Education details: See below. Person educated: Patient and Caregiver   Education method: Medical Illustrator Education comprehension: verbalized understanding, handout  HOME EXERCISE PROGRAM:  HOME EXERCISE PROGRAM [[id number]]  Supine shoulder flexion -  Repeat 10 Repetitions, Complete 2 Sets, Perform 3 Times a Day  ASSESSMENT:  CLINICAL IMPRESSION: Pt arrives for today's treatment session reporting 3/10 right shoulder pain.  Pt reports increased soreness today and is unsure of the cause.  Due to increased pain today, STW/M performed to right deltoid and bicep to decrease pain and tone.  Normal responses to estim and MH noted upon removal.  Pt reported decrease pain and tone at completion of today's treatment session.   OBJECTIVE IMPAIRMENTS: decreased activity tolerance, decreased ROM, and pain.   ACTIVITY LIMITATIONS: carrying, lifting, bending, bathing, dressing, and reach over head  PARTICIPATION LIMITATIONS: meal prep, cleaning, and laundry  PERSONAL FACTORS: Time since onset of injury/illness/exacerbation and 1 comorbidity: cervical dystonia are also affecting patient's functional outcome.   REHAB POTENTIAL: Good  CLINICAL DECISION MAKING: Stable/uncomplicated  EVALUATION COMPLEXITY: Low   GOALS:  SHORT TERM GOALS: Target date: 04/20/24.  Ind with an initial HEP. Goal status: MET   LONG TERM GOALS: Target date: 07/05/24.  Ind with an advanced HEP.  Goal status: IN PROGRESS  2.  Active right shoulder flexion to 145 degrees so the patient can easily reach overhead.  Goal status: IN PROGRESS  3.  Active ER to 70 degrees+ to allow for easily donning/doffing of apparel.  Goal status: IN PROGRESS  4.  Increase shoulder strength to a solid 4+/5 to increase stability for performance of functional activities.  Goal status: IN PROGRESS  5.  Perform ADL's with pain not > 3/10.   Goal status: IN PROGRESS  PLAN:  PT FREQUENCY: 2x/week  PT DURATION: 6 weeks  PLANNED INTERVENTIONS: 97110-Therapeutic exercises, 97530- Therapeutic activity, [id number]- Neuromuscular re-education, 97535- Self Care, 02859- Manual therapy, G0283- Electrical stimulation (unattended), 97016- Vasopneumatic device, [id number]- Ultrasound, Patient/Family education, and Cryotherapy  PLAN FOR NEXT SESSION: 05/18/24:  Per MD note continue 2 times a week for 4 weeks.  Behind back and PRE's elbow close to side.         Delon DELENA Gosling, PTA 06/05/2024, 12:18 PM  "
